# Patient Record
Sex: Male | Born: 1955 | Race: White | Hispanic: No | Marital: Married | Smoking: Never smoker
Health system: Southern US, Community
[De-identification: ages and names within clinical notes are randomized; demographics above are authoritative.]

## PROBLEM LIST (undated history)

## (undated) DIAGNOSIS — I10 Essential (primary) hypertension: Secondary | ICD-10-CM

## (undated) DIAGNOSIS — Z8489 Family history of other specified conditions: Secondary | ICD-10-CM

## (undated) DIAGNOSIS — T4145XA Adverse effect of unspecified anesthetic, initial encounter: Secondary | ICD-10-CM

## (undated) DIAGNOSIS — M1990 Unspecified osteoarthritis, unspecified site: Secondary | ICD-10-CM

## (undated) DIAGNOSIS — T8859XA Other complications of anesthesia, initial encounter: Secondary | ICD-10-CM

## (undated) HISTORY — PX: FINGER SURGERY: SHX640

## (undated) HISTORY — PX: HIP ARTHROSCOPY: SUR88

## (undated) HISTORY — PX: KNEE ARTHROSCOPY: SHX127

## (undated) HISTORY — PX: ANKLE SURGERY: SHX546

## (undated) HISTORY — PX: NASAL SINUS SURGERY: SHX719

## (undated) HISTORY — PX: OTHER SURGICAL HISTORY: SHX169

---

## 2004-10-05 ENCOUNTER — Inpatient Hospital Stay (HOSPITAL_COMMUNITY): Admission: RE | Admit: 2004-10-05 | Discharge: 2004-10-08 | Payer: Self-pay | Admitting: Orthopedic Surgery

## 2005-09-30 ENCOUNTER — Encounter: Admission: RE | Admit: 2005-09-30 | Discharge: 2005-09-30 | Payer: Self-pay | Admitting: Orthopedic Surgery

## 2006-02-02 IMAGING — CR DG HIP (WITH OR WITHOUT PELVIS) 2-3V*L*
3 series · 3 of 3 positions shown · non-contrast
Comparison: None.

CLINICAL DATA: Pre-op evaluation for left hip replacement.  Osteoarthritis.  
 DIAGNOSTIC LEFT HIP ? 2 VIEW:

[view not recorded (1 of 3)]
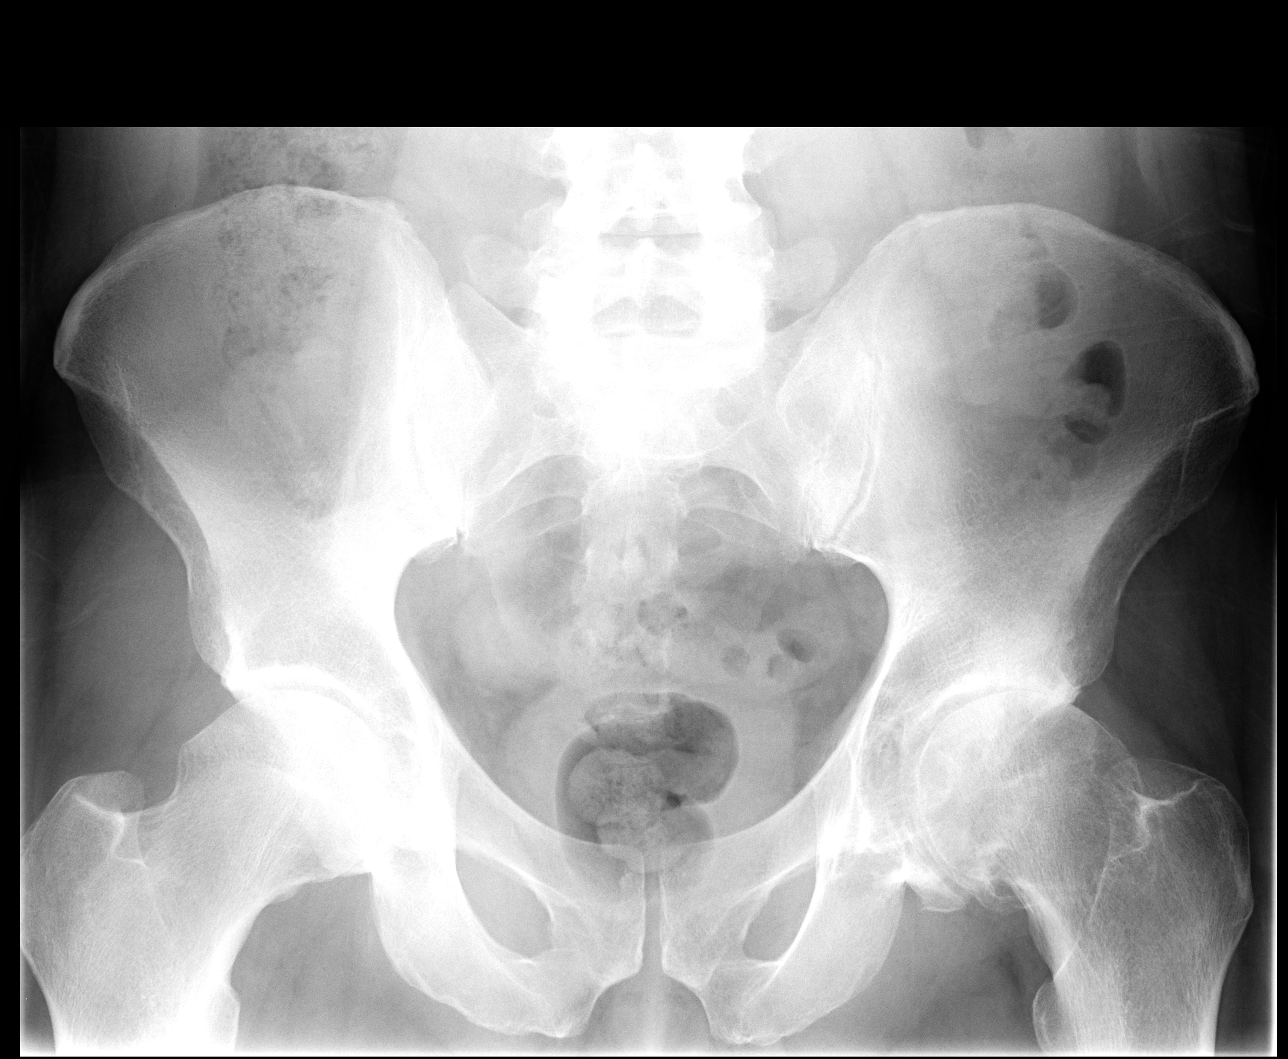

[view not recorded (2 of 3)]
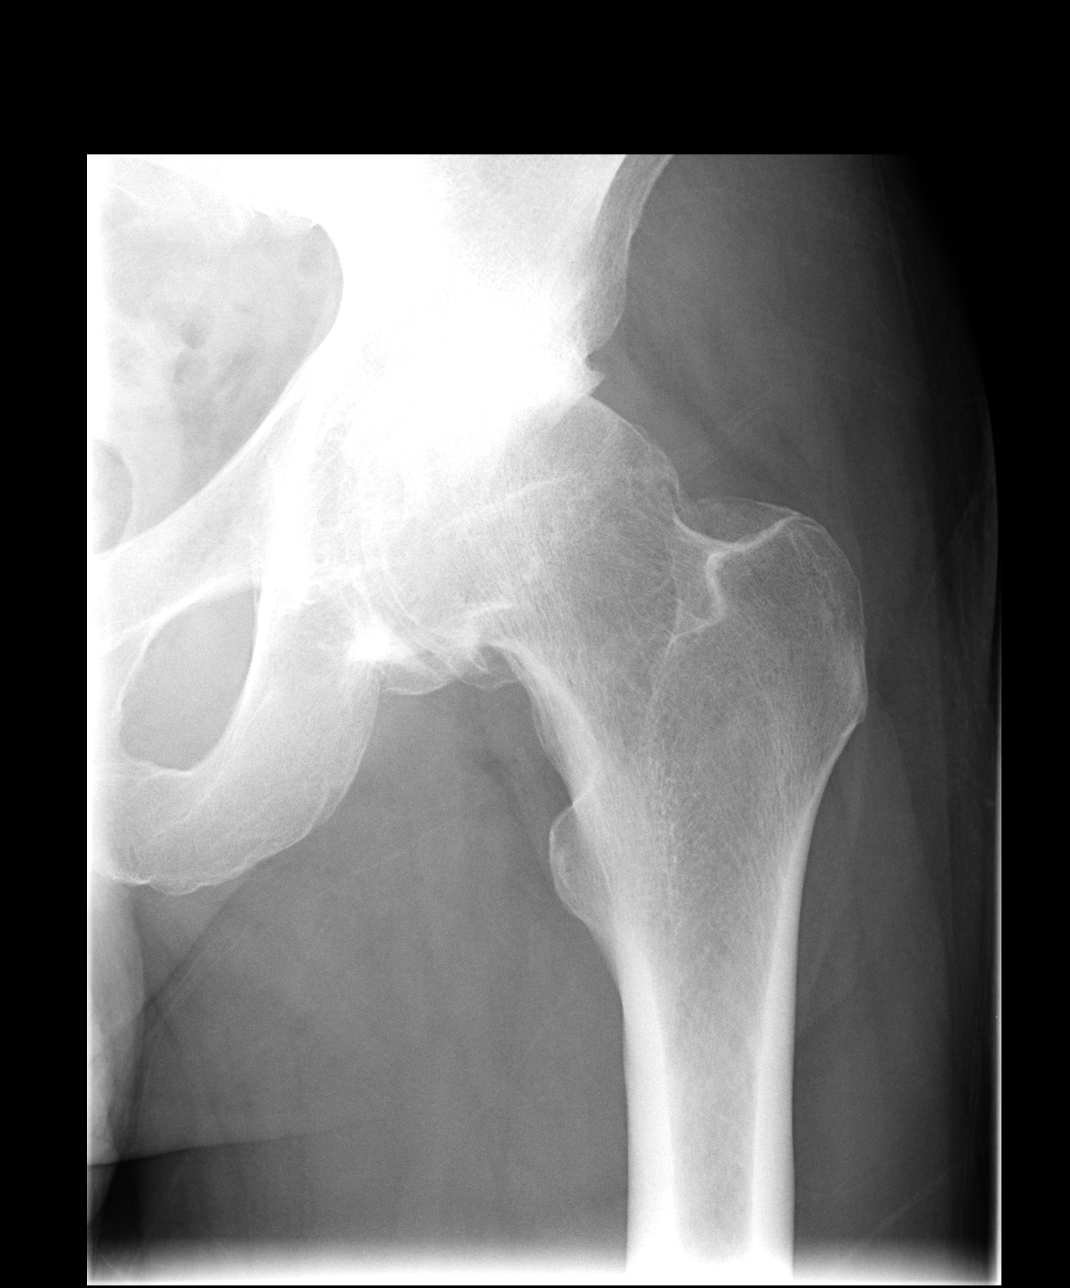

[view not recorded (3 of 3)]
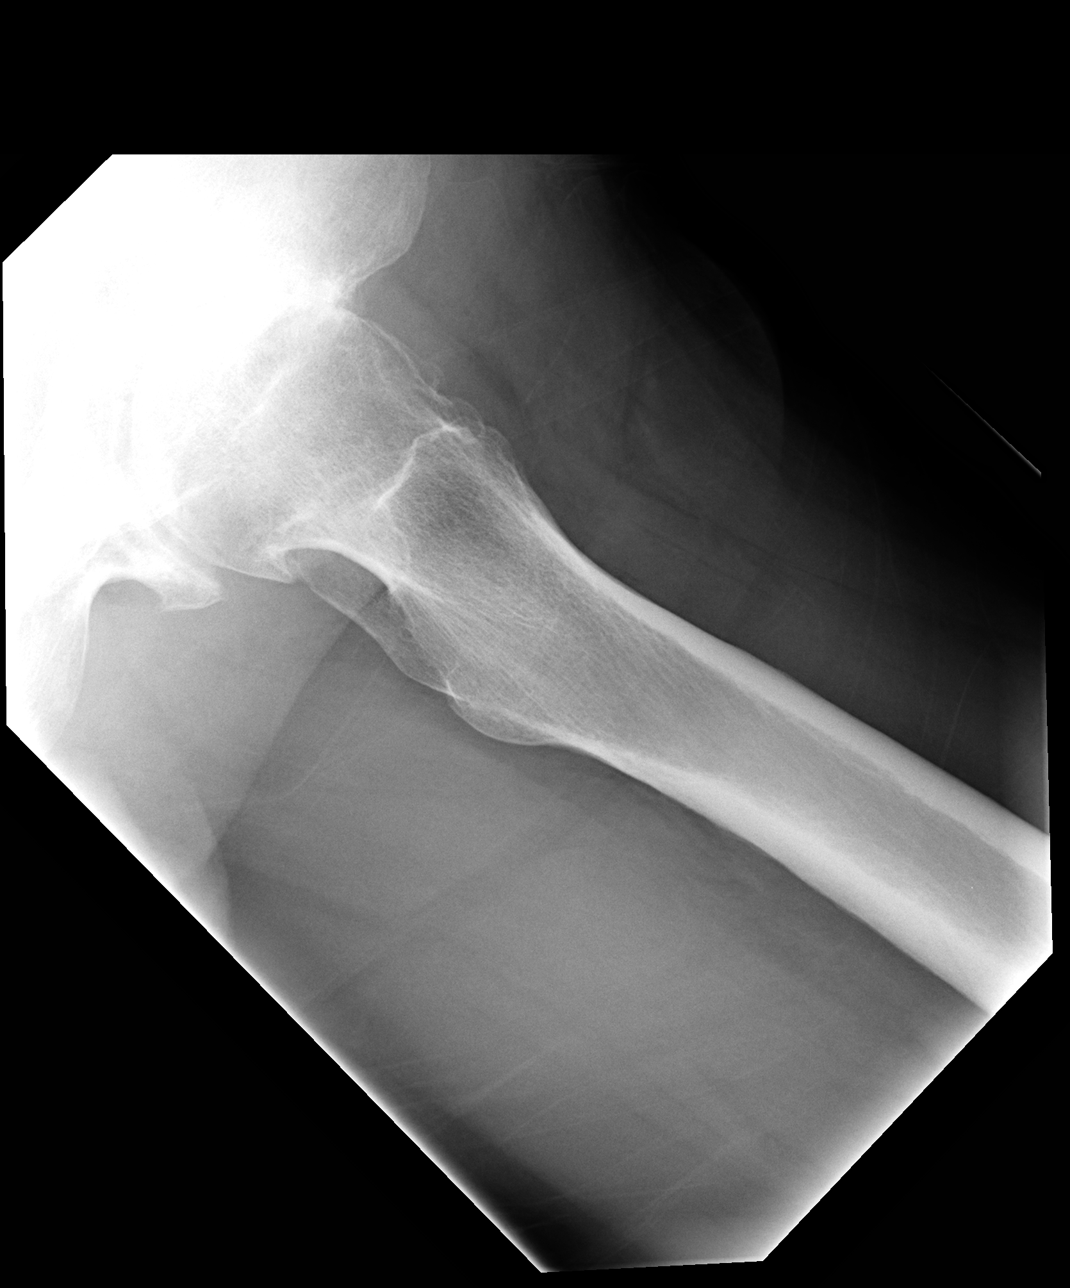

[3 of 3 positions shown; findings below may reference images not displayed]

AP pelvis and AP and frog-leg lateral views of the left hip demonstrate advanced left hip osteoarthritis with superolateral migration of the femoral head, subchondral sclerosis and osteophytosis.  There are mild degenerative changes of the right hip.  Sacroiliac joints appear unremarkable.  Mineralization is adequate.
IMPRESSION: Moderately advanced left hip osteoarthritis.

## 2006-02-06 IMAGING — CR DG PORTABLE PELVIS
1 series · 1 of 1 positions shown · non-contrast
Comparison: none

CLINICAL DATA: Left total hip replacement.
 PORTABLE PELVIS ? 1 VIEW:
 AP view of the pelvis reveals satisfactory position of the acetabular and femoral prosthesis.

[view not recorded]
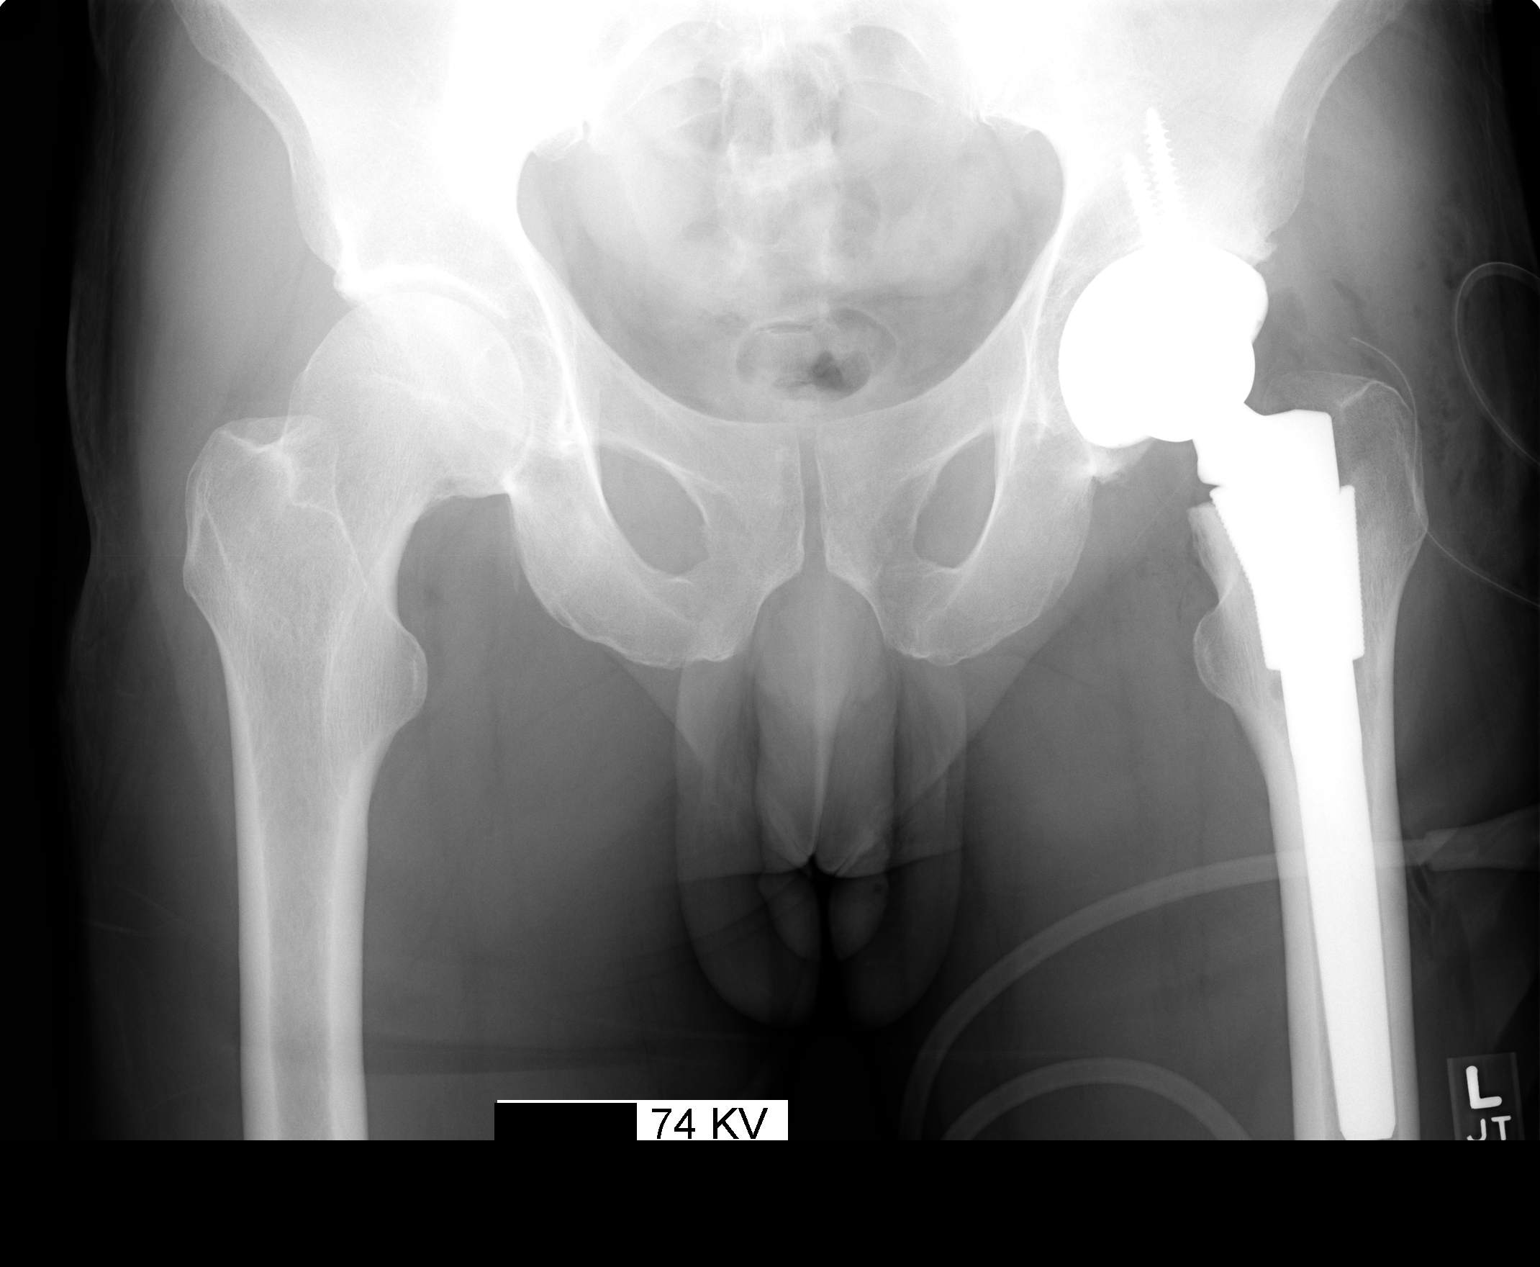

[1 of 1 positions shown; findings below may reference images not displayed]

IMPRESSION: Satisfactory left total hip replacement.

## 2006-02-06 IMAGING — CR DG HIP 1V PORT*L*
1 series · 1 of 1 positions shown · non-contrast
Comparison: none

CLINICAL DATA: Status post left hip arthroplasty.
SINGLE VIEW LEFT HIP ? 10/05/04: 
AP view was obtained after the surgical procedure.  There is normal alignment of a total left hip arthroplasty.  The femoral stem is completely visualized.

[view not recorded]
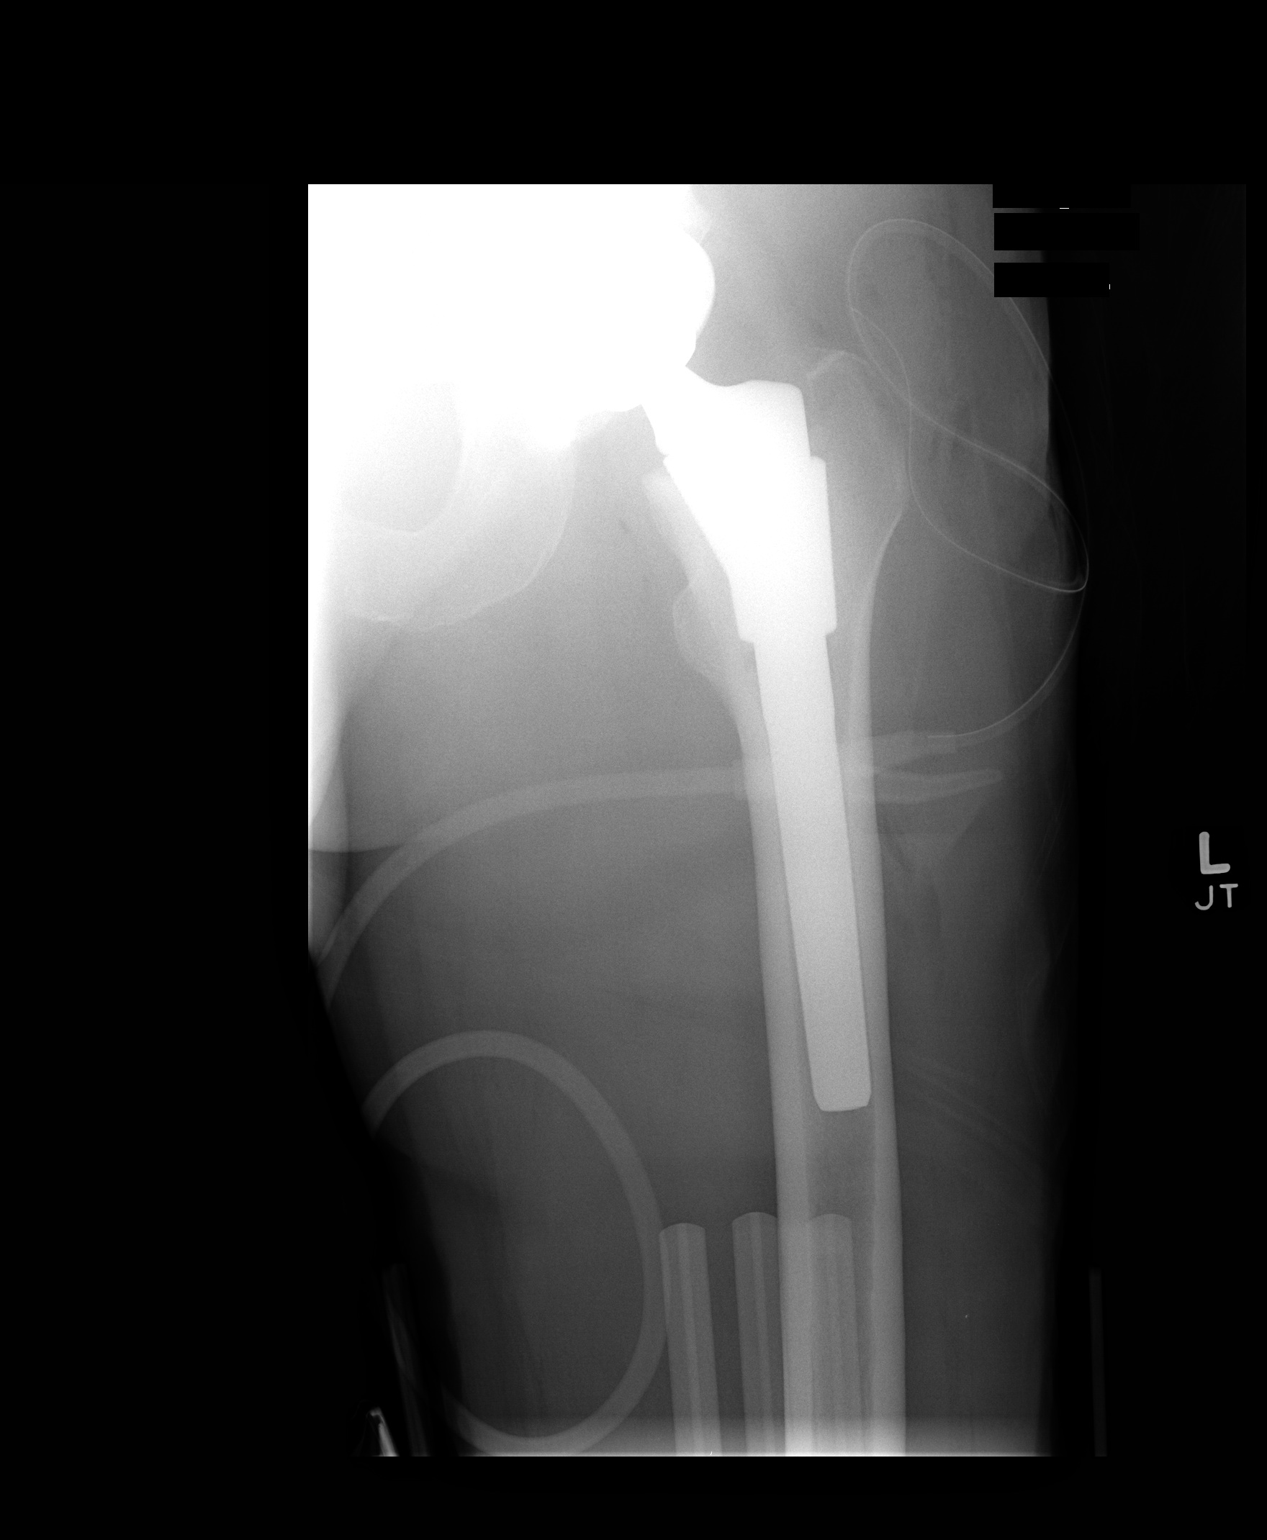

[1 of 1 positions shown; findings below may reference images not displayed]

IMPRESSION: Normal alignment status post left hip arthroplasty.

## 2014-06-24 ENCOUNTER — Other Ambulatory Visit: Payer: Self-pay | Admitting: Orthopedic Surgery

## 2014-06-24 DIAGNOSIS — M25561 Pain in right knee: Secondary | ICD-10-CM

## 2014-07-15 ENCOUNTER — Ambulatory Visit
Admission: RE | Admit: 2014-07-15 | Discharge: 2014-07-15 | Disposition: A | Discharge status: Home / Self Care | Payer: BC Managed Care – PPO | Source: Ambulatory Visit | Attending: Orthopedic Surgery | Admitting: Orthopedic Surgery

## 2014-07-15 DIAGNOSIS — M25561 Pain in right knee: Secondary | ICD-10-CM

## 2014-09-10 ENCOUNTER — Ambulatory Visit: Payer: Self-pay | Admitting: Orthopedic Surgery

## 2014-09-10 NOTE — Progress Notes (Signed)
Preoperative surgical orders have been place into the Epic hospital system for Gavin PhilipsJames R Ormond on 09/10/2014, 11:23 AM  by Patrica DuelPERKINS, Rheana Casebolt for surgery on 10-07-2014.  Preop Uni Knee orders including Experal, IV Tylenol, and IV Decadron as long as there are no contraindications to the above medications. Avel Peacerew Bricen Victory, PA-C

## 2014-09-27 NOTE — Progress Notes (Signed)
EKG per chart 07/15/2014  Clearance note per chart per Dr Melvyn NovasJohns 07/15/2014

## 2014-09-30 ENCOUNTER — Encounter (HOSPITAL_COMMUNITY): Payer: Self-pay

## 2014-09-30 ENCOUNTER — Encounter (HOSPITAL_COMMUNITY)
Admission: RE | Admit: 2014-09-30 | Discharge: 2014-09-30 | Disposition: A | Discharge status: Home / Self Care | Payer: BC Managed Care – PPO | Source: Ambulatory Visit | Attending: Orthopedic Surgery | Admitting: Orthopedic Surgery

## 2014-09-30 DIAGNOSIS — Z01818 Encounter for other preprocedural examination: Secondary | ICD-10-CM | POA: Diagnosis present

## 2014-09-30 DIAGNOSIS — M179 Osteoarthritis of knee, unspecified: Secondary | ICD-10-CM | POA: Insufficient documentation

## 2014-09-30 HISTORY — DX: Family history of other specified conditions: Z84.89

## 2014-09-30 HISTORY — DX: Other complications of anesthesia, initial encounter: T88.59XA

## 2014-09-30 HISTORY — DX: Essential (primary) hypertension: I10

## 2014-09-30 HISTORY — DX: Unspecified osteoarthritis, unspecified site: M19.90

## 2014-09-30 HISTORY — DX: Adverse effect of unspecified anesthetic, initial encounter: T41.45XA

## 2014-09-30 LAB — URINALYSIS, ROUTINE W REFLEX MICROSCOPIC
Bilirubin Urine: NEGATIVE
GLUCOSE, UA: NEGATIVE mg/dL
Hgb urine dipstick: NEGATIVE
Ketones, ur: NEGATIVE mg/dL
Leukocytes, UA: NEGATIVE
Nitrite: NEGATIVE
PH: 5 (ref 5.0–8.0)
PROTEIN: NEGATIVE mg/dL
Specific Gravity, Urine: 1.009 (ref 1.005–1.030)
Urobilinogen, UA: 0.2 mg/dL (ref 0.0–1.0)

## 2014-09-30 LAB — COMPREHENSIVE METABOLIC PANEL
ALT: 24 U/L (ref 0–53)
AST: 33 U/L (ref 0–37)
Albumin: 4.2 g/dL (ref 3.5–5.2)
Alkaline Phosphatase: 58 U/L (ref 39–117)
Anion gap: 7 (ref 5–15)
BUN: 20 mg/dL (ref 6–23)
CALCIUM: 9.4 mg/dL (ref 8.4–10.5)
CO2: 27 mmol/L (ref 19–32)
Chloride: 108 mmol/L (ref 96–112)
Creatinine, Ser: 1.08 mg/dL (ref 0.50–1.35)
GFR, EST AFRICAN AMERICAN: 84 mL/min — AB (ref >90)
GFR, EST NON AFRICAN AMERICAN: 73 mL/min — AB (ref >90)
GLUCOSE: 117 mg/dL — AB (ref 70–99)
Potassium: 3.7 mmol/L (ref 3.5–5.1)
Sodium: 142 mmol/L (ref 135–145)
Total Bilirubin: 0.7 mg/dL (ref 0.3–1.2)
Total Protein: 7 g/dL (ref 6.0–8.3)

## 2014-09-30 LAB — PROTIME-INR
INR: 1.07 (ref 0.00–1.49)
Prothrombin Time: 14 seconds (ref 11.6–15.2)

## 2014-09-30 LAB — CBC
HEMATOCRIT: 42.3 % (ref 39.0–52.0)
HEMOGLOBIN: 14.2 g/dL (ref 13.0–17.0)
MCH: 28.6 pg (ref 26.0–34.0)
MCHC: 33.6 g/dL (ref 30.0–36.0)
MCV: 85.3 fL (ref 78.0–100.0)
Platelets: 172 10*3/uL (ref 150–400)
RBC: 4.96 MIL/uL (ref 4.22–5.81)
RDW: 13.2 % (ref 11.5–15.5)
WBC: 5.2 10*3/uL (ref 4.0–10.5)

## 2014-09-30 LAB — SURGICAL PCR SCREEN
MRSA, PCR: NEGATIVE
STAPHYLOCOCCUS AUREUS: NEGATIVE

## 2014-09-30 LAB — APTT: aPTT: 30 seconds (ref 24–37)

## 2014-09-30 NOTE — Patient Instructions (Addendum)
18 Gavin Patterson  09/30/2014   Your procedure is scheduled on:       Monday October 07, 2014   Report to [facility] Main Entrance and follow signs to  Short Stay Center arrive at 0500 AM.   Call this number if you have problems the morning of surgery [phone] or Presurgical Testing [phone].   Remember:  Do not eat food or drink liquids :After Midnight.  For Living Will and/or Health Care Power Attorney Forms: please provide copy for your medical record, may bring AM of surgery (forms should be already notarized-we do not provide this service).   Take these medicines the morning of surgery with A SIP OF WATER: Hydrocodone-Acetaminophen if needed                               You may not have any metal on your body including hair pins and piercings  Do not wear jewelry,colognes, lotions, powders, or deodorant.  Men may shave face and neck.               Do not bring valuables to the hospital. [facility] IS NOT RESPONSIBLE FOR VALUABLES.  Contacts, dentures or bridgework may not be worn into surgery.  Leave suitcase in the car. After surgery it may be brought to your room.  For patients admitted to the hospital, checkout time is 11:00 AM the day of discharge.   Special Instructions: review fact sheets for MRSA information, Blood Transfusion fact sheet, Incentive Spirometry.    ________________________________________________________________________  [facility] - Preparing for Surgery Before surgery, you can play an important role.  Because skin is not sterile, your skin needs to be as free of germs as possible.  You can reduce the number of germs on your skin by washing with CHG (chlorahexidine gluconate) soap before surgery.  CHG is an antiseptic cleaner which kills germs and bonds with the skin to continue killing germs even after washing. Please DO NOT use if you have an allergy to CHG or antibacterial soaps.  If your skin becomes reddened/irritated stop using the  CHG and inform your nurse when you arrive at Short Stay. Do not shave (including legs and underarms) for at least 48 hours prior to the first CHG shower.  You may shave your face/neck. Please follow these instructions carefully:  1.  Shower with CHG Soap the night before surgery and the  morning of Surgery.  2.  If you choose to wash your hair, wash your hair first as usual with your  normal  shampoo.  3.  After you shampoo, rinse your hair and body thoroughly to remove the  shampoo.                           4.  Use CHG as you would any other liquid soap.  You can apply chg directly  to the skin and wash                       Gently with a scrungie or clean washcloth.  5.  Apply the CHG Soap to your body ONLY FROM THE NECK DOWN.   Do not use on face/ open                           Wound or open sores. Avoid contact  with eyes, ears mouth and genitals (private parts).                       Wash face,  Genitals (private parts) with your normal soap.             6.  Wash thoroughly, paying special attention to the area where your surgery  will be performed.  7.  Thoroughly rinse your body with warm water from the neck down.  8.  DO NOT shower/wash with your normal soap after using and rinsing off  the CHG Soap.                9.  Pat yourself dry with a clean towel.            10.  Wear clean pajamas.            11.  Place clean sheets on your bed the night of your first shower and do not  sleep with pets. Day of Surgery : Do not apply any lotions/deodorants the morning of surgery.  Please wear clean clothes to the hospital/surgery center.  FAILURE TO FOLLOW THESE INSTRUCTIONS MAY RESULT IN THE CANCELLATION OF YOUR SURGERY PATIENT SIGNATURE_________________________________  NURSE SIGNATURE__________________________________  ________________________________________________________________________   Gavin Patterson  An incentive spirometer is a tool that can help keep your lungs clear  and active. This tool measures how well you are filling your lungs with each breath. Taking long deep breaths may help reverse or decrease the chance of developing breathing (pulmonary) problems (especially infection) following:  A long period of time when you are unable to move or be active. BEFORE THE PROCEDURE   If the spirometer includes an indicator to show your best effort, your nurse or respiratory therapist will set it to a desired goal.  If possible, sit up straight or lean slightly forward. Try not to slouch.  Hold the incentive spirometer in an upright position. INSTRUCTIONS FOR USE   Sit on the edge of your bed if possible, or sit up as far as you can in bed or on a chair.  Hold the incentive spirometer in an upright position.  Breathe out normally.  Place the mouthpiece in your mouth and seal your lips tightly around it.  Breathe in slowly and as deeply as possible, raising the piston or the ball toward the top of the column.  Hold your breath for 3-5 seconds or for as long as possible. Allow the piston or ball to fall to the bottom of the column.  Remove the mouthpiece from your mouth and breathe out normally.  Rest for a few seconds and repeat Steps 1 through 7 at least 10 times every 1-2 hours when you are awake. Take your time and take a few normal breaths between deep breaths.  The spirometer may include an indicator to show your best effort. Use the indicator as a goal to work toward during each repetition.  After each set of 10 deep breaths, practice coughing to be sure your lungs are clear. If you have an incision (the cut made at the time of surgery), support your incision when coughing by placing a pillow or rolled up towels firmly against it. Once you are able to get out of bed, walk around indoors and cough well. You may stop using the incentive spirometer when instructed by your caregiver.  RISKS AND COMPLICATIONS  Take your time so you do not get dizzy or  light-headed.  If  you are in pain, you may need to take or ask for pain medication before doing incentive spirometry. It is harder to take a deep breath if you are having pain. AFTER USE  Rest and breathe slowly and easily.  It can be helpful to keep track of a log of your progress. Your caregiver can provide you with a simple table to help with this. If you are using the spirometer at home, follow these instructions: SEEK MEDICAL CARE IF:   You are having difficultly using the spirometer.  You have trouble using the spirometer as often as instructed.  Your pain medication is not giving enough relief while using the spirometer.  You develop fever of 100.5 F (38.1 C) or higher. SEEK IMMEDIATE MEDICAL CARE IF:   You cough up bloody sputum that had not been present before.  You develop fever of 102 F (38.9 C) or greater.  You develop worsening pain at or near the incision site. MAKE SURE YOU:   Understand these instructions.  Will watch your condition.  Will get help right away if you are not doing well or get worse. Document Released: 11/29/2006 Document Revised: 10/11/2011 Document Reviewed: 01/30/2007 ExitCare Patient Information 2014 ExitCare, [place].   ________________________________________________________________________  WHAT IS A BLOOD TRANSFUSION? Blood Transfusion Information  A transfusion is the replacement of blood or some of its parts. Blood is made up of multiple cells which provide different functions.  Red blood cells carry oxygen and are used for blood loss replacement.  White blood cells fight against infection.  Platelets control bleeding.  Plasma helps clot blood.  Other blood products are available for specialized needs, such as hemophilia or other clotting disorders. BEFORE THE TRANSFUSION  Who gives blood for transfusions?   Healthy volunteers who are fully evaluated to make sure their blood is safe. This is blood bank  blood. Transfusion therapy is the safest it has ever been in the practice of medicine. Before blood is taken from a donor, a complete history is taken to make sure that person has no history of diseases nor engages in risky social behavior (examples are intravenous drug use or sexual activity with multiple partners). The donor's travel history is screened to minimize risk of transmitting infections, such as malaria. The donated blood is tested for signs of infectious diseases, such as HIV and hepatitis. The blood is then tested to be sure it is compatible with you in order to minimize the chance of a transfusion reaction. If you or a relative donates blood, this is often done in anticipation of surgery and is not appropriate for emergency situations. It takes many days to process the donated blood. RISKS AND COMPLICATIONS Although transfusion therapy is very safe and saves many lives, the main dangers of transfusion include:   Getting an infectious disease.  Developing a transfusion reaction. This is an allergic reaction to something in the blood you were given. Every precaution is taken to prevent this. The decision to have a blood transfusion has been considered carefully by your caregiver before blood is given. Blood is not given unless the benefits outweigh the risks. AFTER THE TRANSFUSION  Right after receiving a blood transfusion, you will usually feel much better and more energetic. This is especially true if your red blood cells have gotten low (anemic). The transfusion raises the level of the red blood cells which carry oxygen, and this usually causes an energy increase.  The nurse administering the transfusion will monitor you carefully for complications. HOME  CARE INSTRUCTIONS  No special instructions are needed after a transfusion. You may find your energy is better. Speak with your caregiver about any limitations on activity for underlying diseases you may have. SEEK MEDICAL CARE IF:    Your condition is not improving after your transfusion.  You develop redness or irritation at the intravenous (IV) site. SEEK IMMEDIATE MEDICAL CARE IF:  Any of the following symptoms occur over the next 12 hours:  Shaking chills.  You have a temperature by mouth above 102 F (38.9 C), not controlled by medicine.  Chest, back, or muscle pain.  People around you feel you are not acting correctly or are confused.  Shortness of breath or difficulty breathing.  Dizziness and fainting.  You get a rash or develop hives.  You have a decrease in urine output.  Your urine turns a dark color or changes to pink, red, or brown. Any of the following symptoms occur over the next 10 days:  You have a temperature by mouth above 102 F (38.9 C), not controlled by medicine.  Shortness of breath.  Weakness after normal activity.  The white part of the eye turns yellow (jaundice).  You have a decrease in the amount of urine or are urinating less often.  Your urine turns a dark color or changes to pink, red, or brown. Document Released: 07/16/2000 Document Revised: 10/11/2011 Document Reviewed: 03/04/2008 [facility] Patient Information 2014 [place], [place].  _______________________________________________________________________

## 2014-10-01 ENCOUNTER — Encounter (HOSPITAL_COMMUNITY): Payer: Self-pay

## 2014-10-01 NOTE — Progress Notes (Signed)
Your patient has screened at an elevated risk for Obstructive Sleep Apnea using the Stop-Bang Tool during a pre-surgical vist. A score of 4 or greater is an elevated risk. Score of 4. 

## 2014-10-06 ENCOUNTER — Ambulatory Visit: Payer: Self-pay | Admitting: Orthopedic Surgery

## 2014-10-06 NOTE — H&P (Signed)
Gavin Patterson DOB: 07/11/1956 Married / Language: English / Race: White Male Date of Admission:  10/07/2014 CC:  Right Knee Pain History of Present Illness (Alexzandrew L. Perkins III PA-C; 10/03/2014 10:03 AM) The patient is a 58 year old male who comes in for a preoperative History and Physical. The patient is scheduled for a right uni compartmental placement to be performed by Dr. Frank V. Aluisio, MD at [facility] on 10-07-2014. Patient state that his knees feels different each day. It seems to get better and then worse. He is taking ibuprofen as needed. He is having increasing pain in that right knee. It is swelling on him. His pain is mainly on the medial aspect of the knee. It wants to give out. He also has that anterior knee pain. He had the MRI scan done. Showed localized OA in the right knee. He is a good candidate for a uni compartmental replacement. He is ready to proceed with the Uni Replacement Procedure. They have been treated conservatively in the past for the above stated problem and despite conservative measures, they continue to have progressive pain and severe functional limitations and dysfunction. They have failed non-operative management including home exercise, medications, and injections. It is felt that they would benefit from undergoing partial uni joint replacement. Risks and benefits of the procedure have been discussed with the patient and they elect to proceed with surgery. There are no active contraindications to surgery such as ongoing infection or rapidly progressive neurological disease.  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 Childhood  Family History (Alexzandrew L Perkins, III PA-C; 10/03/2014 10:03 AM) Hypertension mother Chronic Obstructive Lung Disease father Congestive Heart Failure mother Depression First Degree Relatives. father  Social History (Alexzandrew L Perkins, III PA-C; 10/03/2014 10:03 AM) Exercise Exercises weekly; does other and gym / weights Drug/Alcohol Rehab (Previously) no Drug/Alcohol Rehab (Currently) no Alcohol use never consumed alcohol Number of flights of stairs before winded greater than 5 Marital status married Illicit drug use no Current work status working full time Children 2 Pain Contract no Living situation live with spouse Tobacco use Never smoker. never smoker Tobacco / smoke exposure yes outdoors only  Medication History (Alexzandrew L Perkins, III PA-C; 10/03/2014 10:05 AM) Glucosamine Chondroitin Complx (Oral) Active. Ibuprofen (200MG Tablet, Oral) Active. Aspirin EC (81MG Tablet DR, Oral) Active. Lisinopril (20MG Tablet, Oral) Active. Norco (5-325MG Tablet, Oral) Active.  Past Surgical History (Alexzandrew L Perkins, III PA-C; 10/03/2014 10:03 AM) Arthroscopy of Knee right Sinus Surgery Total Hip Replacement left Ankle Surgery left  Review of Systems (Alexzandrew L. Perkins III PA-C; 10/03/2014 9:52 AM) General Not Present- Chills, Fatigue, Fever, Memory Loss, Night Sweats, Weight Gain and Weight Loss. Skin Not Present- Eczema, Hives, Itching, Lesions and Rash. HEENT Not Present- Dentures, Double Vision, Headache, Hearing Loss, Tinnitus and Visual Loss. Respiratory Not Present- Allergies, Chronic Cough, Coughing up blood, Shortness of breath at rest and Shortness of breath with exertion. Cardiovascular Not Present- Chest Pain, Difficulty Breathing Lying Down, Murmur, Palpitations, Racing/skipping heartbeats and Swelling. Gastrointestinal Not Present- Abdominal Pain, Bloody Stool, Constipation, Diarrhea, Difficulty  Swallowing, Heartburn, Jaundice, Loss of appetitie, Nausea and Vomiting. Male Genitourinary Not Present- Blood in Urine, Discharge, Flank Pain, Incontinence, Painful Urination, Urgency, Urinary frequency, Urinary   Retention, Urinating at Night and Weak urinary stream. Musculoskeletal Present- Joint Pain and Joint Swelling. Not Present- Back Pain, Morning Stiffness, Muscle Pain, Muscle Weakness and Spasms. Neurological Not Present- Blackout spells, Difficulty with balance, Dizziness, Paralysis, Tremor and Weakness. Psychiatric Not Present- Insomnia.   Vitals (Alexzandrew L. Perkins III PA-C; 10/06/2014 3:19 PM) 10/03/2014 9:53 AM Weight: 188 lb Height: 76in Weight was reported by patient. Height was reported by patient. Body Surface Area: 2.16 m Body Mass Index: 22.88 kg/m  BP: 136/86 (Sitting, Right Arm, Standard)  Physical Exam (Alexzandrew L. Perkins III PA-C; 10/03/2014 10:09 AM) General Mental Status -Alert, cooperative and good historian. General Appearance-pleasant, Not in acute distress. Orientation-Oriented X3. Build & Nutrition-Lean, Well nourished and Well developed. Note: tall framed  Head and Neck Head-normocephalic, atraumatic . Neck Global Assessment - supple, no bruit auscultated on the right, no bruit auscultated on the left.  Eye Pupil - Bilateral-Regular and Round. Motion - Bilateral-EOMI.  Chest and Lung Exam Auscultation Breath sounds - clear at anterior chest wall and clear at posterior chest wall. Adventitious sounds - No Adventitious sounds.  Cardiovascular Auscultation Rhythm - Regular rate and rhythm. Heart Sounds - S1 WNL and S2 WNL. Murmurs & Other Heart Sounds - Auscultation of the heart reveals - No Murmurs.  Abdomen Palpation/Percussion Tenderness - Abdomen is non-tender to palpation. Rigidity (guarding) - Abdomen is soft. Auscultation Auscultation of the abdomen reveals - Bowel sounds normal.  Male Genitourinary Note: Not  done, not pertinent to present illness   Musculoskeletal Note: On examination, he is alert and oriented in no apparent distress. His right knee shows no effusion. His range is about zero to 115. There is some crepitus on range of motion. He is significantly tender medially. There is no lateral tenderness or any instability noted. His hip examination is normal on that side.  MRI is reviewed and he has got pretty significant medial compartment degeneration on the MRI with a meniscal tear. The rest of his knee looks fine. His plain films show minimal narrowing.   Assessment & Plan (Alexzandrew L. 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 Johns - Patient has been seen preoperatively and felt to be stable for surgery.  IV TXA  Anesthesia Issues: None except slow to wake up in the past  Signed electronically by Alexzandrew L Perkins, III PA-C 

## 2014-10-07 ENCOUNTER — Inpatient Hospital Stay (HOSPITAL_COMMUNITY): Payer: BC Managed Care – PPO | Admitting: Anesthesiology

## 2014-10-07 ENCOUNTER — Encounter (HOSPITAL_COMMUNITY)
Admission: RE | Disposition: A | Discharge status: Home / Self Care | Payer: Self-pay | Source: Ambulatory Visit | Attending: Orthopedic Surgery

## 2014-10-07 ENCOUNTER — Observation Stay (HOSPITAL_COMMUNITY)
Admission: RE | Admit: 2014-10-07 | Discharge: 2014-10-08 | Disposition: A | Discharge status: Home / Self Care | Payer: BC Managed Care – PPO | Source: Ambulatory Visit | Attending: Orthopedic Surgery | Admitting: Orthopedic Surgery

## 2014-10-07 ENCOUNTER — Encounter (HOSPITAL_COMMUNITY): Payer: Self-pay | Admitting: *Deleted

## 2014-10-07 DIAGNOSIS — M1711 Unilateral primary osteoarthritis, right knee: Secondary | ICD-10-CM

## 2014-10-07 DIAGNOSIS — E780 Pure hypercholesterolemia: Secondary | ICD-10-CM | POA: Insufficient documentation

## 2014-10-07 DIAGNOSIS — M1710 Unilateral primary osteoarthritis, unspecified knee: Secondary | ICD-10-CM | POA: Diagnosis present

## 2014-10-07 DIAGNOSIS — M179 Osteoarthritis of knee, unspecified: Secondary | ICD-10-CM | POA: Diagnosis present

## 2014-10-07 DIAGNOSIS — Z880 Allergy status to penicillin: Secondary | ICD-10-CM | POA: Diagnosis not present

## 2014-10-07 DIAGNOSIS — Z96642 Presence of left artificial hip joint: Secondary | ICD-10-CM | POA: Diagnosis not present

## 2014-10-07 DIAGNOSIS — I10 Essential (primary) hypertension: Secondary | ICD-10-CM | POA: Diagnosis not present

## 2014-10-07 DIAGNOSIS — Z7982 Long term (current) use of aspirin: Secondary | ICD-10-CM | POA: Insufficient documentation

## 2014-10-07 DIAGNOSIS — M25561 Pain in right knee: Secondary | ICD-10-CM | POA: Diagnosis present

## 2014-10-07 HISTORY — PX: PARTIAL KNEE ARTHROPLASTY: SHX2174

## 2014-10-07 LAB — TYPE AND SCREEN
ABO/RH(D): B POS
ANTIBODY SCREEN: NEGATIVE

## 2014-10-07 LAB — ABO/RH: ABO/RH(D): B POS

## 2014-10-07 SURGERY — ARTHROPLASTY, KNEE, UNICOMPARTMENTAL
Anesthesia: Spinal | Site: Knee | Laterality: Right

## 2014-10-07 MED ORDER — PROPOFOL INFUSION 10 MG/ML OPTIME
INTRAVENOUS | Status: DC | PRN
  Administered 2014-10-07: 140 ug/kg/min via INTRAVENOUS

## 2014-10-07 MED ORDER — ACETAMINOPHEN 650 MG RE SUPP: 650.0000 mg | Freq: Four times a day (QID) | RECTAL | Status: DC | PRN

## 2014-10-07 MED ORDER — PHENYLEPHRINE 40 MCG/ML (10ML) SYRINGE FOR IV PUSH (FOR BLOOD PRESSURE SUPPORT)
PREFILLED_SYRINGE | INTRAVENOUS | Status: AC
  Filled 2014-10-07: qty 10

## 2014-10-07 MED ORDER — OXYCODONE HCL 5 MG PO TABS
5.0000 mg | ORAL_TABLET | ORAL | Status: DC | PRN
  Administered 2014-10-07: 5 mg via ORAL
  Administered 2014-10-07: 10 mg via ORAL
  Administered 2014-10-07: 5 mg via ORAL
  Administered 2014-10-07 – 2014-10-08 (×4): 10 mg via ORAL
  Filled 2014-10-07 (×2): qty 2
  Filled 2014-10-07 (×2): qty 1
  Filled 2014-10-07 (×3): qty 2

## 2014-10-07 MED ORDER — BUPIVACAINE LIPOSOME 1.3 % IJ SUSP
INTRAMUSCULAR | Status: DC | PRN
  Administered 2014-10-07: 20 mL

## 2014-10-07 MED ORDER — SODIUM CHLORIDE 0.9 % IJ SOLN
INTRAMUSCULAR | Status: DC | PRN
  Administered 2014-10-07: 30 mL

## 2014-10-07 MED ORDER — ONDANSETRON HCL 4 MG PO TABS
4.0000 mg | ORAL_TABLET | Freq: Four times a day (QID) | ORAL | Status: DC | PRN
  Administered 2014-10-07: 4 mg via ORAL
  Filled 2014-10-07: qty 1

## 2014-10-07 MED ORDER — BUPIVACAINE HCL (PF) 0.25 % IJ SOLN
INTRAMUSCULAR | Status: AC
  Filled 2014-10-07: qty 30

## 2014-10-07 MED ORDER — FENTANYL CITRATE 0.05 MG/ML IJ SOLN
INTRAMUSCULAR | Status: AC
  Filled 2014-10-07: qty 2

## 2014-10-07 MED ORDER — ACETAMINOPHEN 325 MG PO TABS: 650.0000 mg | ORAL_TABLET | Freq: Four times a day (QID) | ORAL | Status: DC | PRN

## 2014-10-07 MED ORDER — HYDROMORPHONE HCL 1 MG/ML IJ SOLN
0.2500 mg | INTRAMUSCULAR | Status: DC | PRN
  Administered 2014-10-07 (×4): 0.5 mg via INTRAVENOUS

## 2014-10-07 MED ORDER — ACETAMINOPHEN 10 MG/ML IV SOLN
1000.0000 mg | Freq: Once | INTRAVENOUS | Status: AC
  Administered 2014-10-07: 1000 mg via INTRAVENOUS
  Filled 2014-10-07: qty 100

## 2014-10-07 MED ORDER — MIDAZOLAM HCL 2 MG/2ML IJ SOLN
INTRAMUSCULAR | Status: AC
  Filled 2014-10-07: qty 2

## 2014-10-07 MED ORDER — CLINDAMYCIN PHOSPHATE 900 MG/50ML IV SOLN
900.0000 mg | Freq: Once | INTRAVENOUS | Status: AC
  Administered 2014-10-07: 900 mg via INTRAVENOUS
  Filled 2014-10-07: qty 50

## 2014-10-07 MED ORDER — FENTANYL CITRATE 0.05 MG/ML IJ SOLN
INTRAMUSCULAR | Status: DC | PRN
  Administered 2014-10-07: 50 ug via INTRAVENOUS

## 2014-10-07 MED ORDER — BUPIVACAINE HCL (PF) 0.25 % IJ SOLN
INTRAMUSCULAR | Status: DC | PRN
  Administered 2014-10-07: 20 mL

## 2014-10-07 MED ORDER — FLEET ENEMA 7-19 GM/118ML RE ENEM: 1.0000 | ENEMA | Freq: Once | RECTAL | Status: AC | PRN

## 2014-10-07 MED ORDER — BUPIVACAINE LIPOSOME 1.3 % IJ SUSP
20.0000 mL | Freq: Once | INTRAMUSCULAR | Status: DC
  Filled 2014-10-07: qty 20

## 2014-10-07 MED ORDER — DIPHENHYDRAMINE HCL 12.5 MG/5ML PO ELIX: 12.5000 mg | ORAL_SOLUTION | ORAL | Status: DC | PRN

## 2014-10-07 MED ORDER — POTASSIUM CHLORIDE IN NACL 20-0.9 MEQ/L-% IV SOLN
INTRAVENOUS | Status: AC
  Administered 2014-10-07: 100 mL/h via INTRAVENOUS
  Filled 2014-10-07: qty 1000

## 2014-10-07 MED ORDER — DEXAMETHASONE SODIUM PHOSPHATE 10 MG/ML IJ SOLN
10.0000 mg | Freq: Once | INTRAMUSCULAR | Status: AC
  Administered 2014-10-07: 10 mg via INTRAVENOUS

## 2014-10-07 MED ORDER — PROPOFOL 10 MG/ML IV BOLUS
INTRAVENOUS | Status: AC
  Filled 2014-10-07: qty 20

## 2014-10-07 MED ORDER — LACTATED RINGERS IV SOLN
INTRAVENOUS | Status: DC | PRN
  Administered 2014-10-07 (×3): via INTRAVENOUS

## 2014-10-07 MED ORDER — DEXAMETHASONE SODIUM PHOSPHATE 10 MG/ML IJ SOLN
INTRAMUSCULAR | Status: AC
  Filled 2014-10-07: qty 1

## 2014-10-07 MED ORDER — TRAMADOL HCL 50 MG PO TABS: 50.0000 mg | ORAL_TABLET | Freq: Four times a day (QID) | ORAL | Status: DC | PRN

## 2014-10-07 MED ORDER — METOCLOPRAMIDE HCL 5 MG/ML IJ SOLN: 5.0000 mg | Freq: Three times a day (TID) | INTRAMUSCULAR | Status: DC | PRN

## 2014-10-07 MED ORDER — RIVAROXABAN 10 MG PO TABS
10.0000 mg | ORAL_TABLET | Freq: Every day | ORAL | Status: DC
  Administered 2014-10-08: 10 mg via ORAL
  Filled 2014-10-07 (×2): qty 1

## 2014-10-07 MED ORDER — METHOCARBAMOL 500 MG PO TABS
500.0000 mg | ORAL_TABLET | Freq: Four times a day (QID) | ORAL | Status: DC | PRN
  Filled 2014-10-07: qty 1

## 2014-10-07 MED ORDER — DOCUSATE SODIUM 100 MG PO CAPS
100.0000 mg | ORAL_CAPSULE | Freq: Two times a day (BID) | ORAL | Status: DC
  Administered 2014-10-07 – 2014-10-08 (×3): 100 mg via ORAL

## 2014-10-07 MED ORDER — ONDANSETRON HCL 4 MG/2ML IJ SOLN: 4.0000 mg | Freq: Four times a day (QID) | INTRAMUSCULAR | Status: DC | PRN

## 2014-10-07 MED ORDER — PROMETHAZINE HCL 25 MG/ML IJ SOLN: 6.2500 mg | INTRAMUSCULAR | Status: DC | PRN

## 2014-10-07 MED ORDER — CLINDAMYCIN PHOSPHATE 900 MG/50ML IV SOLN
INTRAVENOUS | Status: AC
  Filled 2014-10-07: qty 50

## 2014-10-07 MED ORDER — MENTHOL 3 MG MT LOZG
1.0000 | LOZENGE | OROMUCOSAL | Status: DC | PRN
  Filled 2014-10-07: qty 9

## 2014-10-07 MED ORDER — METOCLOPRAMIDE HCL 10 MG PO TABS: 5.0000 mg | ORAL_TABLET | Freq: Three times a day (TID) | ORAL | Status: DC | PRN

## 2014-10-07 MED ORDER — CEFAZOLIN SODIUM-DEXTROSE 2-3 GM-% IV SOLR: 2.0000 g | INTRAVENOUS | Status: DC

## 2014-10-07 MED ORDER — BUPIVACAINE IN DEXTROSE 0.75-8.25 % IT SOLN
INTRATHECAL | Status: DC | PRN
  Administered 2014-10-07: 1.7 mL via INTRATHECAL

## 2014-10-07 MED ORDER — PROPOFOL 10 MG/ML IV BOLUS
INTRAVENOUS | Status: DC | PRN
  Administered 2014-10-07: 30 mg via INTRAVENOUS

## 2014-10-07 MED ORDER — HYDROMORPHONE HCL 1 MG/ML IJ SOLN
INTRAMUSCULAR | Status: AC
  Filled 2014-10-07: qty 1

## 2014-10-07 MED ORDER — MIDAZOLAM HCL 5 MG/5ML IJ SOLN
INTRAMUSCULAR | Status: DC | PRN
  Administered 2014-10-07: 2 mg via INTRAVENOUS

## 2014-10-07 MED ORDER — ACETAMINOPHEN 500 MG PO TABS
1000.0000 mg | ORAL_TABLET | Freq: Four times a day (QID) | ORAL | Status: AC
  Administered 2014-10-07 – 2014-10-08 (×4): 1000 mg via ORAL
  Filled 2014-10-07 (×4): qty 2

## 2014-10-07 MED ORDER — CLINDAMYCIN PHOSPHATE 600 MG/50ML IV SOLN
600.0000 mg | Freq: Four times a day (QID) | INTRAVENOUS | Status: AC
  Administered 2014-10-07 (×2): 600 mg via INTRAVENOUS
  Filled 2014-10-07 (×2): qty 50

## 2014-10-07 MED ORDER — ZOLPIDEM TARTRATE 10 MG PO TABS: 10.0000 mg | ORAL_TABLET | Freq: Every evening | ORAL | Status: DC | PRN

## 2014-10-07 MED ORDER — CEFAZOLIN SODIUM-DEXTROSE 2-3 GM-% IV SOLR
INTRAVENOUS | Status: AC
  Filled 2014-10-07: qty 50

## 2014-10-07 MED ORDER — SODIUM CHLORIDE 0.9 % IJ SOLN
INTRAMUSCULAR | Status: AC
  Filled 2014-10-07: qty 50

## 2014-10-07 MED ORDER — SODIUM CHLORIDE 0.9 % IR SOLN
Status: DC | PRN
  Administered 2014-10-07: 1000 mL

## 2014-10-07 MED ORDER — POTASSIUM CHLORIDE IN NACL 20-0.9 MEQ/L-% IV SOLN
INTRAVENOUS | Status: DC
  Administered 2014-10-07: 100 mL/h via INTRAVENOUS
  Administered 2014-10-07: 09:00:00 via INTRAVENOUS
  Filled 2014-10-07 (×4): qty 1000

## 2014-10-07 MED ORDER — CHLORHEXIDINE GLUCONATE 4 % EX LIQD: 60.0000 mL | Freq: Once | CUTANEOUS | Status: DC

## 2014-10-07 MED ORDER — KETOROLAC TROMETHAMINE 15 MG/ML IJ SOLN
7.5000 mg | Freq: Four times a day (QID) | INTRAMUSCULAR | Status: DC | PRN
  Administered 2014-10-07 (×2): 7.5 mg via INTRAVENOUS
  Filled 2014-10-07: qty 1

## 2014-10-07 MED ORDER — MORPHINE SULFATE 2 MG/ML IJ SOLN
1.0000 mg | INTRAMUSCULAR | Status: DC | PRN
Start: 2014-10-07 — End: 2014-10-08

## 2014-10-07 MED ORDER — DEXAMETHASONE SODIUM PHOSPHATE 10 MG/ML IJ SOLN
10.0000 mg | Freq: Once | INTRAMUSCULAR | Status: DC
  Filled 2014-10-07: qty 1

## 2014-10-07 MED ORDER — BISACODYL 10 MG RE SUPP: 10.0000 mg | Freq: Every day | RECTAL | Status: DC | PRN

## 2014-10-07 MED ORDER — KETOROLAC TROMETHAMINE 15 MG/ML IJ SOLN
INTRAMUSCULAR | Status: AC
  Administered 2014-10-07: 7.5 mg via INTRAVENOUS
  Filled 2014-10-07: qty 1

## 2014-10-07 MED ORDER — LACTATED RINGERS IV SOLN: INTRAVENOUS | Status: DC

## 2014-10-07 MED ORDER — PHENOL 1.4 % MT LIQD
1.0000 | OROMUCOSAL | Status: DC | PRN
  Filled 2014-10-07: qty 177

## 2014-10-07 MED ORDER — TRANEXAMIC ACID 100 MG/ML IV SOLN
1000.0000 mg | INTRAVENOUS | Status: AC
  Administered 2014-10-07: 1000 mg via INTRAVENOUS
  Filled 2014-10-07: qty 10

## 2014-10-07 MED ORDER — SODIUM CHLORIDE 0.9 % IV SOLN: INTRAVENOUS | Status: DC

## 2014-10-07 MED ORDER — POLYETHYLENE GLYCOL 3350 17 G PO PACK: 17.0000 g | PACK | Freq: Every day | ORAL | Status: DC | PRN

## 2014-10-07 MED ORDER — METHOCARBAMOL 1000 MG/10ML IJ SOLN
500.0000 mg | Freq: Four times a day (QID) | INTRAVENOUS | Status: DC | PRN
  Administered 2014-10-07: 500 mg via INTRAVENOUS
  Filled 2014-10-07 (×2): qty 5

## 2014-10-07 SURGICAL SUPPLY — 54 items
BAG DECANTER FOR FLEXI CONT (MISCELLANEOUS) IMPLANT
BAG ZIPLOCK 12X15 (MISCELLANEOUS) IMPLANT
BANDAGE ELASTIC 6 VELCRO ST LF (GAUZE/BANDAGES/DRESSINGS) ×3 IMPLANT
BANDAGE ESMARK 6X9 LF (GAUZE/BANDAGES/DRESSINGS) ×1 IMPLANT
BLADE SAW RECIPROCATING 77.5 (BLADE) ×3 IMPLANT
BLADE SAW SGTL 13.0X1.19X90.0M (BLADE) ×3 IMPLANT
BNDG ESMARK 6X9 LF (GAUZE/BANDAGES/DRESSINGS) ×3
BOWL SMART MIX CTS (DISPOSABLE) ×3 IMPLANT
BUR OVAL CARBIDE 4.0 (BURR) ×3 IMPLANT
CAPT KNEE PARTIAL 2 ×2 IMPLANT
CEMENT HV SMART SET (Cement) ×3 IMPLANT
CLOSURE WOUND 1/2 X4 (GAUZE/BANDAGES/DRESSINGS) ×1
CUFF TOURN SGL QUICK 34 (TOURNIQUET CUFF) ×2
CUFF TRNQT CYL 34X4X40X1 (TOURNIQUET CUFF) ×1 IMPLANT
DRAPE EXTREMITY T 121X128X90 (DRAPE) ×3 IMPLANT
DRAPE POUCH INSTRU U-SHP 10X18 (DRAPES) ×3 IMPLANT
DRSG ADAPTIC 3X8 NADH LF (GAUZE/BANDAGES/DRESSINGS) ×3 IMPLANT
DRSG AQUACEL AG ADV 3.5X10 (GAUZE/BANDAGES/DRESSINGS) ×3 IMPLANT
DRSG PAD ABDOMINAL 8X10 ST (GAUZE/BANDAGES/DRESSINGS) IMPLANT
DURAPREP 26ML APPLICATOR (WOUND CARE) ×3 IMPLANT
ELECT REM PT RETURN 9FT ADLT (ELECTROSURGICAL) ×3
ELECTRODE REM PT RTRN 9FT ADLT (ELECTROSURGICAL) ×1 IMPLANT
EVACUATOR 1/8 PVC DRAIN (DRAIN) ×3 IMPLANT
FACESHIELD WRAPAROUND (MASK) ×15 IMPLANT
GAUZE SPONGE 4X4 12PLY STRL (GAUZE/BANDAGES/DRESSINGS) IMPLANT
GLOVE BIO SURGEON STRL SZ7.5 (GLOVE) ×3 IMPLANT
GLOVE BIO SURGEON STRL SZ8 (GLOVE) ×3 IMPLANT
GLOVE BIOGEL PI IND STRL 8 (GLOVE) ×2 IMPLANT
GLOVE BIOGEL PI INDICATOR 8 (GLOVE) ×4
GOWN STRL REUS W/TWL LRG LVL3 (GOWN DISPOSABLE) ×3 IMPLANT
GOWN STRL REUS W/TWL XL LVL3 (GOWN DISPOSABLE) ×3 IMPLANT
HANDPIECE INTERPULSE COAX TIP (DISPOSABLE) ×2
IMMOBILIZER KNEE 20 (SOFTGOODS) ×3 IMPLANT
IMMOBILIZER KNEE 20 THIGH 36 (SOFTGOODS) IMPLANT
KIT BASIN OR (CUSTOM PROCEDURE TRAY) ×3 IMPLANT
KIT IMPL STRL TIB IPOLY IUNI ×1 IMPLANT
MANIFOLD NEPTUNE II (INSTRUMENTS) ×3 IMPLANT
NDL SAFETY ECLIPSE 18X1.5 (NEEDLE) ×2 IMPLANT
NEEDLE HYPO 18GX1.5 SHARP (NEEDLE) ×4
PACK TOTAL JOINT (CUSTOM PROCEDURE TRAY) ×3 IMPLANT
PADDING CAST COTTON 6X4 STRL (CAST SUPPLIES) ×3 IMPLANT
POSITIONER SURGICAL ARM (MISCELLANEOUS) ×3 IMPLANT
SET HNDPC FAN SPRY TIP SCT (DISPOSABLE) ×1 IMPLANT
STRIP CLOSURE SKIN 1/2X4 (GAUZE/BANDAGES/DRESSINGS) ×2 IMPLANT
SUCTION FRAZIER 12FR DISP (SUCTIONS) ×3 IMPLANT
SUT MNCRL AB 4-0 PS2 18 (SUTURE) ×3 IMPLANT
SUT VIC AB 2-0 CT1 27 (SUTURE) ×4
SUT VIC AB 2-0 CT1 TAPERPNT 27 (SUTURE) ×2 IMPLANT
SUT VLOC 180 0 24IN GS25 (SUTURE) ×3 IMPLANT
SYR 20CC LL (SYRINGE) ×3 IMPLANT
SYR 50ML LL SCALE MARK (SYRINGE) ×3 IMPLANT
TOWEL OR 17X26 10 PK STRL BLUE (TOWEL DISPOSABLE) ×3 IMPLANT
TRAY FOLEY CATH 14FRSI W/METER (CATHETERS) IMPLANT
TRAY FOLEY CATH 16FRSI W/METER (SET/KITS/TRAYS/PACK) ×3 IMPLANT

## 2014-10-07 NOTE — Transfer of Care (Signed)
Immediate Anesthesia Transfer of Care Note  Patient: Gavin Patterson  Procedure(s) Performed: Procedure(s): RIGHT KNEE UNICOMPARTMENTAL ARTHROPLASTY (Right)  Patient Location: PACU  Anesthesia Type:Regional and Spinal  Level of Consciousness: awake, sedated and patient cooperative  Airway & Oxygen Therapy: Patient Spontanous Breathing and Patient connected to face mask oxygen  Post-op Assessment: Report given to RN and Post -op Vital signs reviewed and stable  Post vital signs: Reviewed and stable  Last Vitals:  Filed Vitals:   10/07/14 0509  BP: 136/97  Pulse: 81  Temp: 36.3 C  Resp: 18    Complications: No apparent anesthesia complications

## 2014-10-07 NOTE — Anesthesia Postprocedure Evaluation (Signed)
  Anesthesia Post-op Note  Patient: Gavin ArgyleJames R Patterson  Procedure(s) Performed: Procedure(s) (LRB): RIGHT KNEE UNICOMPARTMENTAL ARTHROPLASTY (Right)  Patient Location: PACU  Anesthesia Type: Spinal  Level of Consciousness: awake and alert   Airway and Oxygen Therapy: Patient Spontanous Breathing  Post-op Pain: mild  Post-op Assessment: Post-op Vital signs reviewed, Patient's Cardiovascular Status Stable, Respiratory Function Stable, Patent Airway and No signs of Nausea or vomiting  Last Vitals:  Filed Vitals:   10/07/14 1215  BP: 128/85  Pulse: 66  Temp: 36.4 C  Resp: 16    Post-op Vital Signs: stable   Complications: No apparent anesthesia complications

## 2014-10-07 NOTE — H&P (View-Only) (Signed)
Gavin Patterson DOB: [birth year] Married / Language: English / Race: White Male Date of Admission:  10/07/2014 CC:  Right Knee Pain History of Present Illness (Alexzandrew L. Perkins III PA-C; 10/03/2014 10:03 AM) The patient is a 59 year old male who comes in for a preoperative History and Physical. The patient is scheduled for a right uni compartmental placement to be performed by Dr. Gus RankinFrank V. Aluisio, MD at [facility] on 10-07-2014. Patient state that his knees feels different each day. It seems to get better and then worse. He is taking ibuprofen as needed. He is having increasing pain in that right knee. It is swelling on him. His pain is mainly on the medial aspect of the knee. It wants to give out. He also has that anterior knee pain. He had the MRI scan done. Showed localized OA in the right knee. He is a good candidate for a uni compartmental replacement. He is ready to proceed with the Uni Replacement Procedure. They have been treated conservatively in the past for the above stated problem and despite conservative measures, they continue to have progressive pain and severe functional limitations and dysfunction. They have failed non-operative management including home exercise, medications, and injections. It is felt that they would benefit from undergoing partial uni joint replacement. Risks and benefits of the procedure have been discussed with the patient and they elect to proceed with surgery. There are no active contraindications to surgery such as ongoing infection or rapidly progressive neurological disease.  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 Childhood  Family History (Alexzandrew Tessie FassL Perkins, III PA-C; 10/03/2014 10:03 AM) Hypertension mother Chronic Obstructive Lung Disease father Congestive Heart Failure mother Depression First Degree Relatives. father  Social History (Alexzandrew Tessie FassL Perkins, III PA-C; 10/03/2014 10:03 AM) Exercise Exercises weekly; does other and gym / weights Drug/Alcohol Rehab (Previously) no Drug/Alcohol Rehab (Currently) no Alcohol use never consumed alcohol Number of flights of stairs before winded greater than 5 Marital status married Illicit drug use no Current work status working full time Children 2 Pain Contract no Living situation live with spouse Tobacco use Never smoker. never smoker Tobacco / smoke exposure yes outdoors only  Medication History (Alexzandrew L Perkins, III PA-C; 10/03/2014 10:05 AM) Glucosamine Chondroitin Complx (Oral) Active. Ibuprofen (200MG  Tablet, Oral) Active. Aspirin EC (81MG  Tablet DR, Oral) Active. Lisinopril (20MG  Tablet, Oral) Active. Norco (5-325MG  Tablet, Oral) Active.  Past Surgical History (Alexzandrew L Perkins, III PA-C; 10/03/2014 10:03 AM) Arthroscopy of Knee right Sinus Surgery Total Hip Replacement left Ankle Surgery left  Review of Systems (Alexzandrew L. Perkins III PA-C; 10/03/2014 9:52 AM) General Not Present- Chills, Fatigue, Fever, Memory Loss, Night Sweats, Weight Gain and Weight Loss. Skin Not Present- Eczema, Hives, Itching, Lesions and Rash. HEENT Not Present- Dentures, Double Vision, Headache, Hearing Loss, Tinnitus and Visual Loss. Respiratory Not Present- Allergies, Chronic Cough, Coughing up blood, Shortness of breath at rest and Shortness of breath with exertion. Cardiovascular Not Present- Chest Pain, Difficulty Breathing Lying Down, Murmur, Palpitations, Racing/skipping heartbeats and Swelling. Gastrointestinal Not Present- Abdominal Pain, Bloody Stool, Constipation, Diarrhea, Difficulty  Swallowing, Heartburn, Jaundice, Loss of appetitie, Nausea and Vomiting. Male Genitourinary Not Present- Blood in Urine, Discharge, Flank Pain, Incontinence, Painful Urination, Urgency, Urinary frequency, Urinary  Retention, Urinating at Night and Weak urinary stream. Musculoskeletal Present- Joint Pain and Joint Swelling. Not Present- Back Pain, Morning Stiffness, Muscle Pain, Muscle Weakness and Spasms. Neurological Not Present- Blackout spells, Difficulty with balance, Dizziness, Paralysis, Tremor and Weakness. Psychiatric Not Present- Insomnia.   Vitals (Alexzandrew L. Perkins III PA-C; 10/06/2014 3:19 PM) 10/03/2014 9:53 AM Weight: 188 lb Height: 76in Weight was reported by patient. Height was reported by patient. Body Surface Area: 2.16 m Body Mass Index: 22.88 kg/m  BP: 136/86 (Sitting, Right Arm, Standard)  Physical Exam (Alexzandrew L. Perkins III PA-C; 10/03/2014 10:09 AM) General Mental Status -Alert, cooperative and good historian. General Appearance-pleasant, Not in acute distress. Orientation-Oriented X3. Build & Nutrition-Lean, Well nourished and Well developed. Note: tall framed  Head and Neck Head-normocephalic, atraumatic . Neck Global Assessment - supple, no bruit auscultated on the right, no bruit auscultated on the left.  Eye Pupil - Bilateral-Regular and Round. Motion - Bilateral-EOMI.  Chest and Lung Exam Auscultation Breath sounds - clear at anterior chest wall and clear at posterior chest wall. Adventitious sounds - No Adventitious sounds.  Cardiovascular Auscultation Rhythm - Regular rate and rhythm. Heart Sounds - S1 WNL and S2 WNL. Murmurs & Other Heart Sounds - Auscultation of the heart reveals - No Murmurs.  Abdomen Palpation/Percussion Tenderness - Abdomen is non-tender to palpation. Rigidity (guarding) - Abdomen is soft. Auscultation Auscultation of the abdomen reveals - Bowel sounds normal.  Male Genitourinary Note: Not  done, not pertinent to present illness   Musculoskeletal Note: On examination, he is alert and oriented in no apparent distress. His right knee shows no effusion. His range is about zero to 115. There is some crepitus on range of motion. He is significantly tender medially. There is no lateral tenderness or any instability noted. His hip examination is normal on that side.  MRI is reviewed and he has got pretty significant medial compartment degeneration on the MRI with a meniscal tear. The rest of his knee looks fine. His plain films show minimal narrowing.   Assessment & Plan (Alexzandrew L. 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 Melvyn Novas - Patient has been seen preoperatively and felt to be stable for surgery.  IV TXA  Anesthesia Issues: None except slow to wake up in the past  Signed electronically by Lauraine Rinne, III PA-C

## 2014-10-07 NOTE — Op Note (Signed)
OPERATIVE REPORT  PREOPERATIVE DIAGNOSIS: Medial compartment osteoarthritis,Right knee  POSTOPERATIVE DIAGNOSIS: Medial compartment osteoarthritis, Right knee  PROCEDURE:Right knee medial unicompartmental arthroplasty.   SURGEON: Ollen Gross, MD   ASSISTANT: Dimitri Ped, PA-C  ANESTHESIA:  Spinal.   ESTIMATED BLOOD LOSS: Minimal.   DRAINS: Hemovac x1.   TOURNIQUET TIME: 32 minutes at 300 mmHg.   COMPLICATIONS: None.   CONDITION: Stable to recovery.   BRIEF CLINICAL NOTE:Nikai R Vondra is a 59 y.o. male, who has  significant isolated medial compartment arthritis of the Right knee. He has had nonoperative management including cortisone injections. Unfortunately, the pain persists.  Radiograph showed isolated medial compartment bone-on-bone arthritis  with normal-appearing patellofemoral and lateral compartments. He  presents now for right knee unicompartmental arthroplasty.   PROCEDURE IN DETAIL: After successful administration of  Spinal anesthetic, a tourniquet was placed high on the  Right thigh and left lower extremity prepped and draped in usual sterile fashion. Extremity was wrapped in an Esmarch, knee flexed, and tourniquet inflated to 300 mmHg. A midline incision was made with a 10 blade through subcutaneous  tissue to the extensor mechanism. A fresh blade was used to make a  medial parapatellar arthrotomy. Soft tissue on the proximal medial  tibia subperiosteally elevated to the joint line with a knife and into  the semimembranosus bursa with a Cobb elevator. The patella was  subluxed laterally, and the knee flexed 90 degrees. The ACL was intact.  The marginal osteophytes on the medial femur and tibia were removed with  a rongeur. The medial meniscus was also removed. The femoral cutting  block where the conformis unicompartmental knee system was placed along  the femur. There was excellent fit. I traced the outline. We then  removed any  remaining cartilage within this outline. We then placed the  cutting block again and pinned in position. The posterior femoral cut  was made, it was approximately 5 mm. The lug holes for the femoral  component were then drilled through the cutting block. The cutting  block was subsequently removed. We then utilized the high speed burr to  create a small trough at the superior aspect of the components that of  the inset and would not overhang the cartilage. The trial was placed,  it had excellent fit. The trial was subsequently removed.       The trial was placed again and the B chip was placed. There was  excellent balance throughout full motion. Also with excellent fit on  her tibia. This was removed as was the femoral trial. A curette was  used to remove any remaining cartilage from the tibia. The tibial  cutting block was then placed and there was a perfect fit on the tibial  surface. The appropriate slope was placed and it was pinned in  position. The reciprocating saw was used to make the central cut and  then the oscillating saw used to make the horizontal cut. The bone  fragment was then removed. The tibial trial was placed and had perfect  fit on the tibia. We then drilled the 2 lug holes and did the keel punch.  We then placed tibia trial femur, and a 6 mm trial insert. There was  excellent stability throughout full range of motion and no impingement.  The trial was then removed. We drilled small holes in the distal  femur in order to create more conduits for the cement. The cut bone  surfaces were thoroughly irrigated with pulsatile lavage while the  cement was mixed on the back table. We then cemented the tibial  component into place, impacted it and removed the extruded cement. The  same was done for the femoral component. Trial 6-mm inserts placed,  knee held in full extension, and all extruded cement removed. While the  cement was hardening, I injected the extensor mechanism,  periosteum of  the femur and subcu tissues, a total of 20 mL of Exparel mixed with 30  mL of saline and then did an additional injection of 20 mL of 0.25%  Marcaine into the same tissues. When the cement had fully hardened,  then the permanent polyethylene was placed in tibial tray. There was  excellent stability throughout full range of motion with no lift off the  component and no evidence of any impingement. Wound was copiously  irrigated with saline solution, and the arthrotomy closed over a Hemovac  drain with a running #1 V-Loc suture. The subcutaneous was closed with  interrupted 2-0 Vicryl and subcuticular running 4-0 Monocryl. The drain  was hooked to suction. Incision cleaned and dried and Steri-Strips and  a bulky sterile dressing applied. The tourniquet was released after a  total time of 32 minutes. This was done after closing the extensor  mechanism. The wound was closed and a bulky sterile dressing was  applied. He was placed into a knee immobilizer, awakened and  transported to recovery room in stable condition.  Please note that a surgical assistant was a medical necessity for this  procedure in order to perform it in a safe and expeditious manner.  Assistance was necessary for retracting vital ligaments, neurovascular  structures, as well as for proper positioning of the limb to allow for  appropriate bone cuts and appropriate placement of the prosthesis.    Gus RankinFrank V. Kristyne Woodring, MD

## 2014-10-07 NOTE — Interval H&P Note (Signed)
History and Physical Interval Note:  10/07/2014 6:43 AM  Gavin Patterson  has presented today for surgery, with the diagnosis of OA OF RIGHT KNEE  The various methods of treatment have been discussed with the patient and family. After consideration of risks, benefits and other options for treatment, the patient has consented to  Procedure(s): RIGHT KNEE UNICOMPARTMENTAL ARTHROPLASTY (Right) as a surgical intervention .  The patient's history has been reviewed, patient examined, no change in status, stable for surgery.  I have reviewed the patient's chart and labs.  Questions were answered to the patient's satisfaction.     Loanne DrillingALUISIO,Aahana Elza V

## 2014-10-07 NOTE — Evaluation (Addendum)
Physical Therapy Evaluation Patient Details Name: Gavin Patterson MRN: [id number] DOB: 1956/02/03 Today's Date: 10/07/2014   History of Present Illness  R uniknee replacement  Clinical Impression  Pt admitted with above diagnosis. Pt currently with functional limitations due to the deficits listed below (see PT Problem List). * Pt will benefit from skilled PT to increase their independence and safety with mobility to allow discharge to the venue listed below.    Pt ambulated 120' with RW without LOB and with minimal pain. Initiated knee exercises. Excellent progress expected. Will plan to do stair training in the morning.      Follow Up Recommendations Home health PT    Equipment Recommendations  Rolling walker with 5" wheels    Recommendations for Other Services       Precautions / Restrictions Precautions Precautions: Knee Restrictions Weight Bearing Restrictions: No      Mobility  Bed Mobility Overal bed mobility: Modified Independent             General bed mobility comments: self assisted RLE with LLE  Transfers Overall transfer level: Needs assistance Equipment used: Rolling walker (2 wheeled) Transfers: Sit to/from Stand Sit to Stand: Supervision         General transfer comment: cues for hand placment  Ambulation/Gait Ambulation/Gait assistance: Supervision Ambulation Distance (Feet): 120 Feet Assistive device: Rolling walker (2 wheeled) Gait Pattern/deviations: Step-to pattern   Gait velocity interpretation: Below normal speed for age/gender General Gait Details: good sequencing, steady with RW, wants to try crutches tomorrow  Stairs            Wheelchair Mobility    Modified Rankin (Stroke Patients Only)       Balance Overall balance assessment: Modified Independent                                           Pertinent Vitals/Pain Pain Assessment: 0-10 Pain Score: 4  Pain Location: R knee Pain Descriptors  / Indicators: Sore Pain Intervention(s): Monitored during session;Ice applied;Limited activity within patient's tolerance;Premedicated before session    Home Living Family/patient expects to be discharged to:: Private residence Living Arrangements: Spouse/significant other Available Help at Discharge: Family;Available 24 hours/day Type of Home: House Home Access: Stairs to enter Entrance Stairs-Rails: None Entrance Stairs-Number of Steps: 4 Home Layout: One level Home Equipment: None      Prior Function Level of Independence: Independent               Hand Dominance        Extremity/Trunk Assessment   Upper Extremity Assessment: Overall WFL for tasks assessed           Lower Extremity Assessment: RLE deficits/detail RLE Deficits / Details: knee flexion AAROM 60*, SLR 3/5, ankle WNL, knee extension -3/5    Cervical / Trunk Assessment: Normal  Communication   Communication: No difficulties  Cognition Arousal/Alertness: Awake/alert Behavior During Therapy: WFL for tasks assessed/performed Overall Cognitive Status: Within Functional Limits for tasks assessed                      General Comments      Exercises Total Joint Exercises Ankle Circles/Pumps: AROM;Both;10 reps;Supine Quad Sets: AROM;Right;5 reps;Supine Heel Slides: AAROM;Right;10 reps;Supine Straight Leg Raises: AAROM;AROM;Right;5 reps;Supine Goniometric ROM: 60* R knee flexion AAROM      Assessment/Plan    PT Assessment Patient needs continued PT  services  PT Diagnosis Acute pain;Difficulty walking   PT Problem List Decreased strength;Decreased range of motion;Decreased activity tolerance;Pain;Decreased knowledge of use of DME;Decreased mobility  PT Treatment Interventions DME instruction;Gait training;Stair training;Functional mobility training;Therapeutic activities;Patient/family education;Therapeutic exercise   PT Goals (Current goals can be found in the Care Plan section) Acute  Rehab PT Goals Patient Stated Goal: to play golf PT Goal Formulation: With patient Time For Goal Achievement: 10/21/14 Potential to Achieve Goals: Good    Frequency 7X/week   Barriers to discharge        Co-evaluation               End of Session Equipment Utilized During Treatment: Gait belt Activity Tolerance: Patient tolerated treatment well Patient left: in chair;with call bell/phone within reach;with family/visitor present Nurse Communication: Mobility status    Functional Assessment Tool Used: clinical judgement Functional Limitation: Mobility: Walking and moving around Mobility: Walking and Moving Around Current Status [phone]): At least 20 percent but less than 40 percent impaired, limited or restricted Mobility: Walking and Moving Around Goal Status [phone]): At least 1 percent but less than 20 percent impaired, limited or restricted    Time: 0981-1914 PT Time Calculation (min) (ACUTE ONLY): 34 min   Charges:   PT Evaluation $Initial PT Evaluation Tier I: 1 Procedure PT Treatments $Gait Training: 8-22 mins   PT G Codes:   PT G-Codes **NOT FOR INPATIENT CLASS** Functional Assessment Tool Used: clinical judgement Functional Limitation: Mobility: Walking and moving around Mobility: Walking and Moving Around Current Status (N8295): At least 20 percent but less than 40 percent impaired, limited or restricted Mobility: Walking and Moving Around Goal Status [phone]): At least 1 percent but less than 20 percent impaired, limited or restricted    Tamala Ser 10/07/2014, 3:57 PM [phone]

## 2014-10-07 NOTE — Anesthesia Procedure Notes (Signed)
Spinal Patient location during procedure: OR Staffing Anesthesiologist: Montez Hageman Performed by: anesthesiologist  Preanesthetic Checklist Completed: patient identified, site marked, surgical consent, pre-op evaluation, timeout performed, IV checked, risks and benefits discussed and monitors and equipment checked Spinal Block Patient position: sitting Prep: Betadine Patient monitoring: heart rate, continuous pulse ox and blood pressure Approach: midline Location: L4-5 Injection technique: single-shot Needle Needle type: Spinocan and Sprotte  Needle gauge: 24 G Needle length: 9 cm Additional Notes Expiration date of kit checked and confirmed. Patient tolerated procedure well, without complications.

## 2014-10-07 NOTE — Anesthesia Preprocedure Evaluation (Addendum)
Anesthesia Evaluation  Patient identified by MRN, date of birth, ID band Patient awake    Reviewed: Allergy & Precautions, NPO status , Patient's Chart, lab work & pertinent test results  Airway Mallampati: II  TM Distance: >3 FB Neck ROM: Full    Dental no notable dental hx.    Pulmonary neg pulmonary ROS,  breath sounds clear to auscultation  Pulmonary exam normal       Cardiovascular hypertension, Pt. on medications Rhythm:Regular Rate:Normal     Neuro/Psych negative neurological ROS  negative psych ROS   GI/Hepatic negative GI ROS, Neg liver ROS,   Endo/Other  negative endocrine ROS  Renal/GU negative Renal ROS  negative genitourinary   Musculoskeletal negative musculoskeletal ROS (+)   Abdominal   Peds negative pediatric ROS (+)  Hematology negative hematology ROS (+)   Anesthesia Other Findings   Reproductive/Obstetrics negative OB ROS                             Anesthesia Physical Anesthesia Plan  ASA: II  Anesthesia Plan: Spinal   Post-op Pain Management:    Induction:   Airway Management Planned: Simple Face Mask  Additional Equipment:   Intra-op Plan:   Post-operative Plan:   Informed Consent: I have reviewed the patients History and Physical, chart, labs and discussed the procedure including the risks, benefits and alternatives for the proposed anesthesia with the patient or authorized representative who has indicated his/her understanding and acceptance.   Dental advisory given  Plan Discussed with: CRNA  Anesthesia Plan Comments:         Anesthesia Quick Evaluation  

## 2014-10-08 DIAGNOSIS — M1711 Unilateral primary osteoarthritis, right knee: Secondary | ICD-10-CM | POA: Diagnosis not present

## 2014-10-08 LAB — BASIC METABOLIC PANEL
Anion gap: 7 (ref 5–15)
BUN: 13 mg/dL (ref 6–23)
CALCIUM: 9 mg/dL (ref 8.4–10.5)
CO2: 26 mmol/L (ref 19–32)
CREATININE: 0.92 mg/dL (ref 0.50–1.35)
Chloride: 108 mmol/L (ref 96–112)
GFR calc Af Amer: >90 mL/min (ref >90)
GFR, EST NON AFRICAN AMERICAN: 90 mL/min — AB (ref >90)
GLUCOSE: 106 mg/dL — AB (ref 70–99)
Potassium: 4.3 mmol/L (ref 3.5–5.1)
Sodium: 141 mmol/L (ref 135–145)

## 2014-10-08 LAB — CBC
HEMATOCRIT: 36.9 % — AB (ref 39.0–52.0)
Hemoglobin: 12.5 g/dL — ABNORMAL LOW (ref 13.0–17.0)
MCH: 28.9 pg (ref 26.0–34.0)
MCHC: 33.9 g/dL (ref 30.0–36.0)
MCV: 85.2 fL (ref 78.0–100.0)
Platelets: 176 10*3/uL (ref 150–400)
RBC: 4.33 MIL/uL (ref 4.22–5.81)
RDW: 13.3 % (ref 11.5–15.5)
WBC: 11.2 10*3/uL — ABNORMAL HIGH (ref 4.0–10.5)

## 2014-10-08 MED ORDER — TRAMADOL HCL 50 MG PO TABS: 50.0000 mg | ORAL_TABLET | Freq: Four times a day (QID) | ORAL | Status: AC | PRN

## 2014-10-08 MED ORDER — METHOCARBAMOL 500 MG PO TABS: 500.0000 mg | ORAL_TABLET | Freq: Four times a day (QID) | ORAL | Status: AC | PRN

## 2014-10-08 MED ORDER — OXYCODONE HCL 5 MG PO TABS: 5.0000 mg | ORAL_TABLET | ORAL | Status: AC | PRN

## 2014-10-08 MED ORDER — RIVAROXABAN 10 MG PO TABS: 10.0000 mg | ORAL_TABLET | Freq: Every day | ORAL | Status: AC

## 2014-10-08 NOTE — Progress Notes (Signed)
   Subjective: 1 Day Post-Op Procedure(s) (LRB): RIGHT KNEE UNICOMPARTMENTAL ARTHROPLASTY (Right) Patient reports pain as mild.   Patient seen in rounds with Dr. Lequita HaltAluisio.  He was sitting up in the chair on morning rounds. Patient is well, but has had some minor complaints of pain in the knee, requiring pain medications Patient is ready to go home today  Objective: Vital signs in last 24 hours: Temp:  [95.8 F (35.4 C)-98.8 F (37.1 C)] 97.6 F (36.4 C) (03/08 0602) Pulse Rate:  [49-68] 62 (03/08 0602) Resp:  [8-18] 16 (03/08 0602) BP: (110-137)/(65-91) 126/81 mmHg (03/08 0602) SpO2:  [97 %-100 %] 98 % (03/08 0602) Weight:  [83.915 kg (185 lb)] 83.915 kg (185 lb) (03/07 1015)  Intake/Output from previous day:  Intake/Output Summary (Last 24 hours) at 10/08/14 0745 Last data filed at 10/08/14 0609  Gross per 24 hour  Intake   3280 ml  Output   3710 ml  Net   -430 ml    Intake/Output this shift: UOP 700 since MN  Labs:  Recent Labs  10/08/14 0507  HGB 12.5*    Recent Labs  10/08/14 0507  WBC 11.2*  RBC 4.33  HCT 36.9*  PLT 176    Recent Labs  10/08/14 0507  NA 141  K 4.3  CL 108  CO2 26  BUN 13  CREATININE 0.92  GLUCOSE 106*  CALCIUM 9.0   No results for input(s): LABPT, INR in the last 72 hours.  EXAM: General - Patient is Alert, Appropriate and Oriented Extremity - Neurovascular intact Sensation intact distally Dorsiflexion/Plantar flexion intact Dressing - clean, dry, only scant drainage at distal end  Motor Function - intact, moving foot and toes well on exam.   Assessment/Plan: 1 Day Post-Op Procedure(s) (LRB): RIGHT KNEE UNICOMPARTMENTAL ARTHROPLASTY (Right) Procedure(s) (LRB): RIGHT KNEE UNICOMPARTMENTAL ARTHROPLASTY (Right) Past Medical History  Diagnosis Date  . Complication of anesthesia     slow to awaken   . Family history of adverse reaction to anesthesia     pts mother severe vomiting   . Hypertension   . Arthritis     Principal Problem:   OA (osteoarthritis) of knee  Estimated body mass index is 22.53 kg/(m^2) as calculated from the following:   Height as of this encounter: 6\' 4"  (1.93 m).   Weight as of this encounter: 83.915 kg (185 lb). Advance diet Up with therapy Discharge home with home health Diet - Cardiac diet Follow up - in 1 week on Tuesday 3/15 Activity - WBAT Disposition - Home Condition Upon Discharge - Good D/C Meds - See DC Summary DVT Prophylaxis - Xarelto for 10 days and then a full dose aspirin  Gavin Peacerew Ivelis Norgard, PA-C Orthopaedic Surgery 10/08/2014, 7:45 AM

## 2014-10-08 NOTE — Care Management Note (Signed)
    Page 1 of 1   10/08/2014     12:10:27 PM CARE MANAGEMENT NOTE 10/08/2014  Patient:  TREYSEAN, PETRUZZI   Account Number:  [id number]  Date Initiated:  10/08/2014  Documentation initiated by:  [facility]  Subjective/Objective Assessment:   adm: OBS/Medial compartment osteoarthritis,Right knee     Action/Plan:   discharge planning   Anticipated DC Date:  10/08/2014   Anticipated DC Plan:  [place]  CM consult      [facility] Choice  HOME HEALTH   Choice offered to / List presented to:  C-1 Patient   DME arranged  [facility]        HH arranged  HH-2 PT      [place]   Status of service:  Completed, signed off Medicare Important Message given?   (If response is "NO", the following Medicare IM given date fields will be blank) Date Medicare IM given:   Medicare IM given by:   Date Additional Medicare IM given:   Additional Medicare IM given by:    Discharge Disposition:  [place]  Per UR Regulation:    If discussed at Long Length of Stay Meetings, dates discussed:    Comments:  10/08/14 08:00 Cm met with pt and wife, Letta Median [phone] in room to offer choice of home health agency.  Pt chooses [facility] to render HHPT. Ortho tech delivered crutches to room prior to discharge.  Address and contact information verified by pt.  Referral emailed to [employer], Tim. No other CM needs were communicated.  Mariane Masters, BSN, Cm [phone].

## 2014-10-08 NOTE — Progress Notes (Signed)
OT Cancellation Note  Patient Details Name: Gavin PhilipsJames R Kobrin MRN: [id number] DOB: 06-Feb-1956   Cancelled Treatment:    Reason Eval/Treat Not Completed: Other (comment). Pt screened for OT.  He has bars in bathroom and shower seat. Wife will assist with ADLS  Jahmir Salo 10/08/2014, 10:31 AM  Marica OtterMaryellen Elmor Kost, OTR/L [phone] 10/08/2014

## 2014-10-08 NOTE — Discharge Instructions (Signed)
Dr. Ollen Gross Total Joint Specialist [facility] [address] [place], [place] [address] [phone]  UNI KNEE REPLACEMENT POSTOPERATIVE DIRECTIONS   Knee Rehabilitation, Guidelines Following Surgery  Results after knee surgery are often greatly improved when you follow the exercise, range of motion and muscle strengthening exercises prescribed by your doctor. Safety measures are also important to protect the knee from further injury. Any time any of these exercises cause you to have increased pain or swelling in your knee joint, decrease the amount until you are comfortable again and slowly increase them. If you have problems or questions, call your caregiver or physical therapist for advice.   HOME CARE INSTRUCTIONS  Remove items at home which could result in a fall. This includes throw rugs or furniture in walking pathways.  Continue medications as instructed at time of discharge. You may have some home medications which will be placed on hold until you complete the course of blood thinner medication.  You may start showering once you are discharged home but do not submerge the incision under water.  Walk with walker as instructed.  You may resume a sexual relationship in one month or when given the OK by your doctor.   Use walker as long as suggested by your caregivers.  Avoid periods of inactivity such as sitting longer than an hour when not asleep. This helps prevent blood clots.  You may put full weight on your legs and walk as much as is comfortable.  You may return to work once you are cleared by your doctor.  Do not drive a car for 6 weeks or until released by you surgeon.   Do not drive while taking narcotics.  Wear the elastic stockings for three weeks following surgery during the day but you may remove then at night. Make sure you keep all of your appointments after your operation with all of your doctors and caregivers. You should call the  office at the above phone number and make an appointment for approximately two weeks after the date of your surgery. Leave the current dressing on for one week.  After one week, may change the dressing using 4X4 guaze and paper tape. Please pick up a stool softener and laxative for home use as long as you are requiring pain medications.  ICE to the affected knee every three hours for 30 minutes at a time and then as needed for pain and swelling.  Continue to use ice on the knee for pain and swelling from surgery. You may notice swelling that will progress down to the foot and ankle.  This is normal after surgery.  Elevate the leg when you are not up walking on it.   It is important for you to complete the blood thinner medication as prescribed by your doctor.  Continue to use the breathing machine which will help keep your temperature down.  It is common for your temperature to cycle up and down following surgery, especially at night when you are not up moving around and exerting yourself.  The breathing machine keeps your lungs expanded and your temperature down.  RANGE OF MOTION AND STRENGTHENING EXERCISES  Rehabilitation of the knee is important following a knee injury or an operation. After just a few days of immobilization, the muscles of the thigh which control the knee become weakened and shrink (atrophy). Knee exercises are designed to build up the tone and strength of the thigh muscles and to improve knee motion. Often times  heat used for twenty to thirty minutes before working out will loosen up your tissues and help with improving the range of motion but do not use heat for the first two weeks following surgery. These exercises can be done on a training (exercise) mat, on the floor, on a table or on a bed. Use what ever works the best and is most comfortable for you Knee exercises include:  Leg Lifts - While your knee is still immobilized in a splint or cast, you can do straight leg raises.  Lift the leg to 60 degrees, hold for 3 sec, and slowly lower the leg. Repeat 10-20 times 2-3 times daily. Perform this exercise against resistance later as your knee gets better.  Quad and Hamstring Sets - Tighten up the muscle on the front of the thigh (Quad) and hold for 5-10 sec. Repeat this 10-20 times hourly. Hamstring sets are done by pushing the foot backward against an object and holding for 5-10 sec. Repeat as with quad sets.  A rehabilitation program following serious knee injuries can speed recovery and prevent re-injury in the future due to weakened muscles. Contact your doctor or a physical therapist for more information on knee rehabilitation.   SKILLED REHAB INSTRUCTIONS: If the patient is transferred to a skilled rehab facility following release from the hospital, a list of the current medications will be sent to the facility for the patient to continue.  When discharged from the skilled rehab facility, please have the facility set up the patient's Home Health Physical Therapy prior to being released. Also, the skilled facility will be responsible for providing the patient with their medications at time of release from the facility to include their pain medication, the muscle relaxants, and their blood thinner medication. If the patient is still at the rehab facility at time of the two week follow up appointment, the skilled rehab facility will also need to assist the patient in arranging follow up appointment in our office and any transportation needs.  MAKE SURE YOU:  Understand these instructions.  Will watch your condition.  Will get help right away if you are not doing well or get worse.    Pick up stool softner and laxative for home use following surgery while on pain medications. Do not submerge incision/dressing under water. Please use good hand washing techniques while changing dressing each day. May shower starting following discharge from hospital Please use a clean towel  to pat the incision/dressing dry following showers. Continue to use ice for pain and swelling after surgery. Do not use any lotions or creams on the incision until instructed by your surgeon.  Take Xarelto for a total of ten days and then discontinue the Xarelto. After completing the Xarelto, take a full dose 325 mg Aspirin for three more weeks.  Postoperative Constipation Protocol  Constipation - defined medically as fewer than three stools per week and severe constipation as less than one stool per week.  One of the most common issues patients have following surgery is constipation.  Even if you have a regular bowel pattern at home, your normal regimen is likely to be disrupted due to multiple reasons following surgery.  Combination of anesthesia, postoperative narcotics, change in appetite and fluid intake all can affect your bowels.  In order to avoid complications following surgery, here are some recommendations in order to help you during your recovery period.  Colace (docusate) - Pick up an over-the-counter form of Colace or another stool softener and take twice  a day as long as you are requiring postoperative pain medications.  Take with a full glass of water daily.  If you experience loose stools or diarrhea, hold the colace until you stool forms back up.  If your symptoms do not get better within 1 week or if they get worse, check with your doctor.  Dulcolax (bisacodyl) - Pick up over-the-counter and take as directed by the product packaging as needed to assist with the movement of your bowels.  Take with a full glass of water.  Use this product as needed if not relieved by Colace only.   MiraLax (polyethylene glycol) - Pick up over-the-counter to have on hand.  MiraLax is a solution that will increase the amount of water in your bowels to assist with bowel movements.  Take as directed and can mix with a glass of water, juice, soda, coffee, or tea.  Take if you go more than two days  without a movement. Do not use MiraLax more than once per day. Call your doctor if you are still constipated or irregular after using this medication for 7 days in a row.  If you continue to have problems with postoperative constipation, please contact the office for further assistance and recommendations.  If you experience "the worst abdominal pain ever" or develop nausea or vomiting, please contact the office immediatly for further recommendations for treatment.

## 2014-10-08 NOTE — Progress Notes (Signed)
Physical Therapy Treatment Patient Details Name: Bunnie PhilipsJames R Boughner MRN: [id number] DOB: 20-Dec-1955 Today's Date: 10/08/2014    History of Present Illness R uniknee replacement    PT Comments    POD # 1 am session.  Pt dressed and eager to D/C to home.  amb in hallway with B crutches vs walker this time which pt much preferred.  Practiced going up/down 4 steps with spouse.  Performed all supine knee HEP TE's with instruction on proper tech and freq.  Ended with ICE.  All other questions addressed and pt ready to D/C to home with spouse.    Follow Up Recommendations  Home health PT     Equipment Recommendations  Crutches (issued and sized)    Recommendations for Other Services       Precautions / Restrictions Precautions Precautions: Knee Restrictions Weight Bearing Restrictions: No    Mobility  Bed Mobility Overal bed mobility: Modified Independent                Transfers Overall transfer level: Modified independent               General transfer comment: good safety cognition   Ambulation/Gait Ambulation/Gait assistance: Modified independent (Device/Increase time) Ambulation Distance (Feet): 285 Feet Assistive device: Crutches Gait Pattern/deviations: Step-through pattern Gait velocity: WFL   General Gait Details: amb with b crutches.  Performed well.  <25% VC's safety with turns and negociating self in tight spaces    Stairs Stairs: Yes Stairs assistance: Supervision Stair Management: No rails;Step to pattern;Forwards;With crutches Number of Stairs: 4 General stair comments: 25% VC's on proper sequencing and B crutch placement with decend.  Performed with spouse present.   Wheelchair Mobility    Modified Rankin (Stroke Patients Only)       Balance                                    Cognition Arousal/Alertness: Awake/alert Behavior During Therapy: WFL for tasks assessed/performed Overall Cognitive Status: Within  Functional Limits for tasks assessed                      Exercises   Uni Knee Replacement TE's 10 reps B LE ankle pumps 10 reps towel squeezes 10 reps knee presses 10 reps heel slides  10 reps SAQ's 10 reps SLR's 10 reps ABD Followed by ICE'    General Comments        Pertinent Vitals/Pain Pain Assessment: 0-10 Pain Score: 3  Pain Location: R knee Pain Descriptors / Indicators: Sore Pain Intervention(s): Monitored during session;Premedicated before session;Repositioned;Ice applied    Home Living                      Prior Function            PT Goals (current goals can now be found in the care plan section) Progress towards PT goals: Progressing toward goals    Frequency  7X/week    PT Plan      Co-evaluation             End of Session Equipment Utilized During Treatment: Gait belt Activity Tolerance: Patient tolerated treatment well Patient left: in bed;with call bell/phone within reach     Time: 0935-1015 PT Time Calculation (min) (ACUTE ONLY): 40 min  Charges:  $Gait Training: 8-22 mins $Therapeutic Exercise: 8-22 mins $Therapeutic Activity: 8-22 mins  G Codes:      Rica Koyanagi  PTA [facility]  Acute  Rehab Pager      [phone]

## 2014-10-08 NOTE — Discharge Summary (Signed)
Physician Discharge Summary   Patient ID: Gavin Patterson MRN: [id number] DOB/AGE: 61-Nov-1957 61 y.o.  Admit date: 10/07/2014 Discharge date: 10-08-2014  Primary Diagnosis:  Medial compartment osteoarthritis,Right knee Admission Diagnoses:  Past Medical History  Diagnosis Date  . Complication of anesthesia     slow to awaken   . Family history of adverse reaction to anesthesia     pts mother severe vomiting   . Hypertension   . Arthritis    Discharge Diagnoses:   Principal Problem:   OA (osteoarthritis) of knee  Estimated body mass index is 22.53 kg/(m^2) as calculated from the following:   Height as of this encounter: 6' 4"  (1.93 m).   Weight as of this encounter: 83.915 kg (185 lb).  Procedure:  Procedure(s) (LRB): RIGHT KNEE UNICOMPARTMENTAL ARTHROPLASTY (Right)   Consults: None  HPI: Gavin Patterson is a 59 y.o. male, who has  significant isolated medial compartment arthritis of the Right knee. He has had nonoperative management including cortisone injections. Unfortunately, the pain persists.  Radiograph showed isolated medial compartment bone-on-bone arthritis  with normal-appearing patellofemoral and lateral compartments. He  presents now for right knee unicompartmental arthroplasty.   Laboratory Data: Admission on 10/07/2014  Component Date Value Ref Range Status  . ABO/RH(D) 10/07/2014 B POS   Final  . Antibody Screen 10/07/2014 NEG   Final  . Sample Expiration 10/07/2014 10/10/2014   Final  . ABO/RH(D) 10/07/2014 B POS   Final  . WBC 10/08/2014 11.2* 4.0 - 10.5 K/uL Final  . RBC 10/08/2014 4.33  4.22 - 5.81 MIL/uL Final  . Hemoglobin 10/08/2014 12.5* 13.0 - 17.0 g/dL Final  . HCT 10/08/2014 36.9* 39.0 - 52.0 % Final  . MCV 10/08/2014 85.2  78.0 - 100.0 fL Final  . MCH 10/08/2014 28.9  26.0 - 34.0 pg Final  . MCHC 10/08/2014 33.9  30.0 - 36.0 g/dL Final  . RDW 10/08/2014 13.3  11.5 - 15.5 % Final  . Platelets 10/08/2014 176  150 - 400 K/uL Final    . Sodium 10/08/2014 141  135 - 145 mmol/L Final  . Potassium 10/08/2014 4.3  3.5 - 5.1 mmol/L Final  . Chloride 10/08/2014 108  96 - 112 mmol/L Final  . CO2 10/08/2014 26  19 - 32 mmol/L Final  . Glucose, Bld 10/08/2014 106* 70 - 99 mg/dL Final  . BUN 10/08/2014 13  6 - 23 mg/dL Final  . Creatinine, Ser 10/08/2014 0.92  0.50 - 1.35 mg/dL Final  . Calcium 10/08/2014 9.0  8.4 - 10.5 mg/dL Final  . GFR calc non Af Amer 10/08/2014 90* >90 mL/min Final  . GFR calc Af Amer 10/08/2014 >90  >90 mL/min Final   Comment: (NOTE) The eGFR has been calculated using the CKD EPI equation. This calculation has not been validated in all clinical situations. eGFR's persistently <90 mL/min signify possible Chronic Kidney Disease.   Georgiann Hahn gap 10/08/2014 7  5 - 15 Final  Hospital Outpatient Visit on 09/30/2014  Component Date Value Ref Range Status  . MRSA, PCR 09/30/2014 NEGATIVE  NEGATIVE Final  . Staphylococcus aureus 09/30/2014 NEGATIVE  NEGATIVE Final   Comment:        The Xpert SA Assay (FDA approved for NASAL specimens in patients over 56 years of age), is one component of a comprehensive surveillance program.  Test performance has been validated by [facility] for patients greater than or equal to 67 year old. It is not intended to diagnose infection nor to guide  or monitor treatment.   Marland Kitchen aPTT 09/30/2014 30  24 - 37 seconds Final  . WBC 09/30/2014 5.2  4.0 - 10.5 K/uL Final  . RBC 09/30/2014 4.96  4.22 - 5.81 MIL/uL Final  . Hemoglobin 09/30/2014 14.2  13.0 - 17.0 g/dL Final  . HCT 09/30/2014 42.3  39.0 - 52.0 % Final  . MCV 09/30/2014 85.3  78.0 - 100.0 fL Final  . MCH 09/30/2014 28.6  26.0 - 34.0 pg Final  . MCHC 09/30/2014 33.6  30.0 - 36.0 g/dL Final  . RDW 09/30/2014 13.2  11.5 - 15.5 % Final  . Platelets 09/30/2014 172  150 - 400 K/uL Final  . Sodium 09/30/2014 142  135 - 145 mmol/L Final  . Potassium 09/30/2014 3.7  3.5 - 5.1 mmol/L Final  . Chloride 09/30/2014 108  96 -  112 mmol/L Final  . CO2 09/30/2014 27  19 - 32 mmol/L Final  . Glucose, Bld 09/30/2014 117* 70 - 99 mg/dL Final  . BUN 09/30/2014 20  6 - 23 mg/dL Final  . Creatinine, Ser 09/30/2014 1.08  0.50 - 1.35 mg/dL Final  . Calcium 09/30/2014 9.4  8.4 - 10.5 mg/dL Final  . Total Protein 09/30/2014 7.0  6.0 - 8.3 g/dL Final  . Albumin 09/30/2014 4.2  3.5 - 5.2 g/dL Final  . AST 09/30/2014 33  0 - 37 U/L Final  . ALT 09/30/2014 24  0 - 53 U/L Final  . Alkaline Phosphatase 09/30/2014 58  39 - 117 U/L Final  . Total Bilirubin 09/30/2014 0.7  0.3 - 1.2 mg/dL Final  . GFR calc non Af Amer 09/30/2014 73* >90 mL/min Final  . GFR calc Af Amer 09/30/2014 84* >90 mL/min Final   Comment: (NOTE) The eGFR has been calculated using the CKD EPI equation. This calculation has not been validated in all clinical situations. eGFR's persistently <90 mL/min signify possible Chronic Kidney Disease.   . Anion gap 09/30/2014 7  5 - 15 Final  . Prothrombin Time 09/30/2014 14.0  11.6 - 15.2 seconds Final  . INR 09/30/2014 1.07  0.00 - 1.49 Final  . Color, Urine 09/30/2014 YELLOW  YELLOW Final  . APPearance 09/30/2014 CLEAR  CLEAR Final  . Specific Gravity, Urine 09/30/2014 1.009  1.005 - 1.030 Final  . pH 09/30/2014 5.0  5.0 - 8.0 Final  . Glucose, UA 09/30/2014 NEGATIVE  NEGATIVE mg/dL Final  . Hgb urine dipstick 09/30/2014 NEGATIVE  NEGATIVE Final  . Bilirubin Urine 09/30/2014 NEGATIVE  NEGATIVE Final  . Ketones, ur 09/30/2014 NEGATIVE  NEGATIVE mg/dL Final  . Protein, ur 09/30/2014 NEGATIVE  NEGATIVE mg/dL Final  . Urobilinogen, UA 09/30/2014 0.2  0.0 - 1.0 mg/dL Final  . Nitrite 09/30/2014 NEGATIVE  NEGATIVE Final  . Leukocytes, UA 09/30/2014 NEGATIVE  NEGATIVE Final   MICROSCOPIC NOT DONE ON URINES WITH NEGATIVE PROTEIN, BLOOD, LEUKOCYTES, NITRITE, OR GLUCOSE <1000 mg/dL.     X-Rays:No results found.  EKG:No orders found for this or any previous visit.   Hospital Course: YUNUS STOKLOSA is a 59 y.o.  who was admitted to [facility]. They were brought to the operating room on 10/07/2014 and underwent Procedure(s): RIGHT KNEE UNICOMPARTMENTAL ARTHROPLASTY.  Patient tolerated the procedure well and was later transferred to the recovery room and then to the orthopaedic floor for postoperative care.  They were given PO and IV analgesics for pain control following their surgery.  They were given 24 hours of postoperative antibiotics of  Anti-infectives  Start     Dose/Rate Route Frequency Ordered Stop   10/07/14 1300  clindamycin (CLEOCIN) IVPB 600 mg     600 mg 100 mL/hr over 30 Minutes Intravenous Every 6 hours 10/07/14 1019 10/07/14 1832   10/07/14 0700  clindamycin (CLEOCIN) IVPB 900 mg     900 mg 100 mL/hr over 30 Minutes Intravenous  Once 10/07/14 0653 10/07/14 0713   10/07/14 0511  ceFAZolin (ANCEF) IVPB 2 g/50 mL premix  Status:  Discontinued     2 g 100 mL/hr over 30 Minutes Intravenous On call to O.R. 10/07/14 1740 10/07/14 1007     and started on DVT prophylaxis in the form of Xarelto.   PT and OT were ordered for total joint protocol.  Discharge planning consulted to help with postop disposition and equipment needs.  Patient had a good night on the evening of surgery.  They started to get up OOB with therapy on day one. Hemovac drain came out the evening of surgery.  Dressing was removed on day one and the Aquacel dressing that covered the incision was left in place.  Patient was seen in rounds and was ready to go home later that same day on POD 1.  Discharge home with home health Diet - Cardiac diet Follow up - in 1 week on Tuesday 3/15 Activity - WBAT Disposition - Home Condition Upon Discharge - Good D/C Meds - See DC Summary DVT Prophylaxis - Xarelto for 10 days and then a full dose aspirin  Discharge Instructions    Call MD / Call 911    Complete by:  As directed   If you experience chest pain or shortness of breath, CALL 911 and be transported to the hospital  emergency room.  If you develope a fever above 101 F, pus (white drainage) or increased drainage or redness at the wound, or calf pain, call your surgeon's office.     Change dressing    Complete by:  As directed   Leave the current dressing on for one week.  After one week, may change dressing daily with sterile 4 x 4 inch gauze dressing and apply TED hose.     Constipation Prevention    Complete by:  As directed   Drink plenty of fluids.  Prune juice may be helpful.  You may use a stool softener, such as Colace (over the counter) 100 mg twice a day.  Use MiraLax (over the counter) for constipation as needed.     Diet - low sodium heart healthy    Complete by:  As directed      Discharge instructions    Complete by:  As directed   Leave the current dressing in place for one week.  After one week, may change the dressing and apply 4X4 gauze and paper tape to incision. Pick up stool softner and laxative for home use following surgery while on pain medications. Do not submerge incision under water. Please use good hand washing techniques. May shower starting following discharge. Continue to use ice for pain and swelling after surgery. Do not use any lotions or creams on the incision until instructed by your surgeon.  Take Xarelto for ten days and then discontinue the Xarelto. Once the patient has completed the Xarelto, they may resume the 325 mg Aspirin.  Postoperative Constipation Protocol  Constipation - defined medically as fewer than three stools per week and severe constipation as less than one stool per week.  One of the most common issues  patients have following surgery is constipation.  Even if you have a regular bowel pattern at home, your normal regimen is likely to be disrupted due to multiple reasons following surgery.  Combination of anesthesia, postoperative narcotics, change in appetite and fluid intake all can affect your bowels.  In order to avoid complications following  surgery, here are some recommendations in order to help you during your recovery period.  Colace (docusate) - Pick up an over-the-counter form of Colace or another stool softener and take twice a day as long as you are requiring postoperative pain medications.  Take with a full glass of water daily.  If you experience loose stools or diarrhea, hold the colace until you stool forms back up.  If your symptoms do not get better within 1 week or if they get worse, check with your doctor.  Dulcolax (bisacodyl) - Pick up over-the-counter and take as directed by the product packaging as needed to assist with the movement of your bowels.  Take with a full glass of water.  Use this product as needed if not relieved by Colace only.   MiraLax (polyethylene glycol) - Pick up over-the-counter to have on hand.  MiraLax is a solution that will increase the amount of water in your bowels to assist with bowel movements.  Take as directed and can mix with a glass of water, juice, soda, coffee, or tea.  Take if you go more than two days without a movement. Do not use MiraLax more than once per day. Call your doctor if you are still constipated or irregular after using this medication for 7 days in a row.  If you continue to have problems with postoperative constipation, please contact the office for further assistance and recommendations.  If you experience "the worst abdominal pain ever" or develop nausea or vomiting, please contact the office immediatly for further recommendations for treatment.     Do not put a pillow under the knee. Place it under the heel.    Complete by:  As directed      Do not sit on low chairs, stoools or toilet seats, as it may be difficult to get up from low surfaces    Complete by:  As directed      Driving restrictions    Complete by:  As directed   No driving until released by the physician.     Increase activity slowly as tolerated    Complete by:  As directed      Lifting  restrictions    Complete by:  As directed   No lifting until released by the physician.     Patient may shower    Complete by:  As directed   You may shower without a dressing once there is no drainage.  Do not wash over the wound.  If drainage remains, do not shower until drainage stops.     TED hose    Complete by:  As directed   Use stockings (TED hose) for 3 weeks on both leg(s).  You may remove them at night for sleeping.     Weight bearing as tolerated    Complete by:  As directed   Laterality:  right  Extremity:  Lower            Medication List    STOP taking these medications        aspirin EC 325 MG tablet     HYDROcodone-acetaminophen 5-325 MG per tablet  Commonly known as:  NORCO/VICODIN     ibuprofen 200 MG tablet  Commonly known as:  ADVIL,MOTRIN      TAKE these medications        lisinopril 10 MG tablet  Commonly known as:  PRINIVIL,ZESTRIL  Take 10 mg by mouth at bedtime.     methocarbamol 500 MG tablet  Commonly known as:  ROBAXIN  Take 1 tablet (500 mg total) by mouth every 6 (six) hours as needed for muscle spasms.     oxyCODONE 5 MG immediate release tablet  Commonly known as:  Oxy IR/ROXICODONE  Take 1-2 tablets (5-10 mg total) by mouth every 3 (three) hours as needed for moderate pain, severe pain or breakthrough pain.     rivaroxaban 10 MG Tabs tablet  Commonly known as:  XARELTO  - Take 1 tablet (10 mg total) by mouth daily with breakfast. Take Xarelto for ten more days, then discontinue Xarelto.  - Once the patient has completed the Xarelto, they may resume the 325 mg Aspirin.     traMADol 50 MG tablet  Commonly known as:  ULTRAM  Take 1-2 tablets (50-100 mg total) by mouth every 6 (six) hours as needed (mild pain).     zolpidem 10 MG tablet  Commonly known as:  AMBIEN  Take 10 mg by mouth at bedtime as needed for sleep.           Follow-up Information    Follow up with Gearlean Alf, MD. Schedule an appointment as soon as  possible for a visit on 10/15/2014.   Specialty:  Orthopedic Surgery   Why:  Call office at [phone] to setup appointment in one week on Tuesday 3/15 with Dr. Wynelle Link.   Contact information:   [address] [place] [address] [phone]       Signed: Arlee Muslim, PA-C Orthopaedic Surgery 10/08/2014, 8:19 AM

## 2014-10-08 NOTE — Progress Notes (Signed)
UR completed
